# Patient Record
Sex: Male | Born: 1990 | Race: White | Hispanic: Yes | Marital: Single | Smoking: Never smoker
Health system: Southern US, Community
[De-identification: ages and names within clinical notes are randomized; demographics above are authoritative.]

## PROBLEM LIST (undated history)

## (undated) DIAGNOSIS — N319 Neuromuscular dysfunction of bladder, unspecified: Secondary | ICD-10-CM

## (undated) DIAGNOSIS — Q059 Spina bifida, unspecified: Secondary | ICD-10-CM

## (undated) HISTORY — PX: MENINGOCELE REPAIR: SHX712

## (undated) HISTORY — DX: Neuromuscular dysfunction of bladder, unspecified: N31.9

## (undated) HISTORY — PX: VENTRICULOPERITONEAL SHUNT: SHX204

## (undated) HISTORY — DX: Spina bifida, unspecified: Q05.9

---
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
## 2011-02-04 ENCOUNTER — Ambulatory Visit (INDEPENDENT_AMBULATORY_CARE_PROVIDER_SITE_OTHER): Payer: Medicaid Other | Admitting: Family Medicine

## 2011-02-04 ENCOUNTER — Other Ambulatory Visit: Payer: Self-pay | Admitting: Family Medicine

## 2011-02-04 VITALS — BP 143/92 | HR 166 | Temp 99.0°F

## 2011-02-04 DIAGNOSIS — A499 Bacterial infection, unspecified: Secondary | ICD-10-CM | POA: Insufficient documentation

## 2011-02-04 DIAGNOSIS — N3942 Incontinence without sensory awareness: Secondary | ICD-10-CM

## 2011-02-04 DIAGNOSIS — N390 Urinary tract infection, site not specified: Secondary | ICD-10-CM

## 2011-02-04 DIAGNOSIS — G839 Paralytic syndrome, unspecified: Secondary | ICD-10-CM

## 2011-02-04 LAB — POCT URINALYSIS DIPSTICK
Ketones, UA: NEGATIVE
Protein, UA: >300
Spec Grav, UA: 1.025
Urobilinogen, UA: 0.2

## 2011-02-04 LAB — POCT UA - MICROSCOPIC ONLY

## 2011-02-04 MED ORDER — CEPHALEXIN 500 MG PO CAPS: 500.0000 mg | ORAL_CAPSULE | Freq: Three times a day (TID) | ORAL | Status: DC

## 2011-02-04 MED ORDER — CEFTRIAXONE SODIUM 1 G IJ SOLR
1.0000 g | Freq: Once | INTRAMUSCULAR | Status: AC
  Administered 2011-02-04: 1 g via INTRAMUSCULAR

## 2011-02-04 NOTE — Assessment & Plan Note (Addendum)
Although he has been performing I&0 catheterizations QID he appears to have an upper tract infection. Will start with IM Ceftriaxone in case he develops vomiting before the oral medication is effective. Prescription written due to unreliability of [facility] e prescribing

## 2011-02-04 NOTE — Assessment & Plan Note (Signed)
unchanged

## 2011-02-04 NOTE — Assessment & Plan Note (Signed)
Unchanged

## 2011-02-04 NOTE — Patient Instructions (Signed)
Has recibido una inyeccion de antibiotico.  Tiene que continuar con Cephalexin para a lo menos 7 dias. Va a la sala de emergencia si empiece a vomitar y no puedo tomar el antibiotico.   Regrese en Hector Shea   [facility] in [facility]

## 2011-02-04 NOTE — Progress Notes (Signed)
  Subjective:    Patient ID: Hector Shea, male    DOB: 06/07/1990, 20 y.o.   MRN: [id number]  HPI one was last seen in [facility] at urology Department on October 31. He denies having infection symptoms but they did do a urine of which I did not get a result. November 2 he began having fever chills possibly looking urine with odor and slight blood. He's had decreased appetite, and mild nausea but no vomiting. He has been able to drink liquids well. He had loose bowel movement yesterday and today.  He continues to do school primarily learning English. Enjoys this but finds it difficult. He only had 6 years of schooling in [place]   Review of Systems     Objective:   Physical Exam He appears moderately ill sitting in a wheelchair. Back no CVA tenderness Chest clear Heart regular rhythm without murmur but significant tachycardia Abdomen exam is sitting shows old scars from prior surgery but no focal tenderness       Assessment & Plan:

## 2011-02-06 LAB — GRAM STAIN
Gram Stain: NONE SEEN
Gram Stain: NONE SEEN

## 2011-02-06 LAB — URINE CULTURE: Colony Count: >=100000

## 2011-02-11 ENCOUNTER — Ambulatory Visit (INDEPENDENT_AMBULATORY_CARE_PROVIDER_SITE_OTHER): Payer: Medicaid Other | Admitting: Family Medicine

## 2011-02-11 ENCOUNTER — Encounter: Payer: Self-pay | Admitting: Family Medicine

## 2011-02-11 VITALS — BP 117/70 | HR 80 | Temp 97.8°F

## 2011-02-11 DIAGNOSIS — A499 Bacterial infection, unspecified: Secondary | ICD-10-CM

## 2011-02-11 DIAGNOSIS — N390 Urinary tract infection, site not specified: Secondary | ICD-10-CM

## 2011-02-11 DIAGNOSIS — Z23 Encounter for immunization: Secondary | ICD-10-CM

## 2011-02-11 MED ORDER — CEPHALEXIN 500 MG PO CAPS: 500.0000 mg | ORAL_CAPSULE | Freq: Three times a day (TID) | ORAL | Status: AC

## 2011-02-11 MED ORDER — KETOCONAZOLE 2 % EX CREA: TOPICAL_CREAM | Freq: Two times a day (BID) | CUTANEOUS | Status: DC

## 2011-02-11 NOTE — Patient Instructions (Signed)
Fue un Research officer, trade union.  Me alegro que esta' mejor que la semana pasada, y que no ha vuelto a Warehouse manager mas fiebres ni escalofrios.   Estoy extendiendo su tratamiento con Keflex 500mg , una capsula tres veces por dia, por 3 dias mas (un total de 10 dias de tratamiento).  Por favor llame si comienza a tener fiebres, escalofrios, si la orina comienza a aparecer turbia o maloliente, o con otras preguntas o problemas.

## 2011-02-11 NOTE — Assessment & Plan Note (Signed)
Patient here for seven-day followup of what appears to have been an upper tract urinary tract infection. At the time of his prior presentation he had fevers chills and malodorous urine. It was thought that this is related to self-catheterization. He has had no prior UTIs according to parents. He has responded remarkably well to oral Keflex and has had no fevers or chills since beginning the Keflex and the one dose of ceftriaxone IM last week. His urine culture done last week had a large amount of bacteria without identification of an isolated organism. Given his clinical improvement, I do not believe it is useful to collect another urine today. I am extending his Keflex treatment by 3 more days for a total of 10 days of treatment. He is given strict instructions on reasons to call or return to clinic for evaluation, those being fevers chills or return of malodorous or turbid urine. He has an appointment with his urologist in [place] on December 31. He knows not to wait until then if he should have a recurrence of symptoms.

## 2011-02-11 NOTE — Progress Notes (Signed)
  Subjective:    Patient ID: Hector Shea, male    DOB: 08-11-1990, 20 y.o.   MRN: [id number]  HPI Visit conducted in Spanish. The patient is present with both of his parents. This visit is a followup from one week ago, when on presented with chills fever and foul-smelling urine. He was treated with IM ceftriaxone x1, then Keflex 500 mg capsules, one capsule 3 times daily for 7 days. He has one capsule remaining. He reports he has had no further episodes of fevers or chills, and his urine has returned to normal. He has been eating and drinking normally, and has had no diarrhea or changes in bowel habits despite the Keflex.  He reports that he has had some central facial redness which has been diagnosed as fungal in the past and has responded nicely to ketoconazole cream. He asks for refill of this   Review of Systems No fevers chills, no nausea vomiting, no malodorous urine.    Objective:   Physical Exam Well-appearing, in a wheelchair, pleasant and a good historian. No apparent distress  HEENT neck is supple no cervical adenopathy. Oropharynx is clear without exudates. Slight cobblestoning is noted. Moist mucous membranes are noted. Heart: Regular S1-S2 without extra Lungs: Clear to auscultation bilaterally. Abdomen: Soft nontender and nondistended  Surgical scars are noted and healed.       Assessment & Plan:

## 2011-02-11 NOTE — Progress Notes (Signed)
Addended byArlyss Repress on: 02/11/2011 03:12 PM   Modules accepted: Orders

## 2011-03-11 ENCOUNTER — Ambulatory Visit (INDEPENDENT_AMBULATORY_CARE_PROVIDER_SITE_OTHER): Payer: Medicaid Other | Admitting: Family Medicine

## 2011-03-11 VITALS — BP 140/82 | HR 85 | Temp 97.8°F

## 2011-03-11 DIAGNOSIS — J029 Acute pharyngitis, unspecified: Secondary | ICD-10-CM

## 2011-03-11 DIAGNOSIS — J020 Streptococcal pharyngitis: Secondary | ICD-10-CM

## 2011-03-11 MED ORDER — PENICILLIN G BENZATHINE & PROC 1200000 UNIT/2ML IM SUSP
1.2000 10*6.[IU] | Freq: Once | INTRAMUSCULAR | Status: AC
  Administered 2011-03-11: 1.2 10*6.[IU] via INTRAMUSCULAR

## 2011-03-11 NOTE — Assessment & Plan Note (Signed)
Has [facility] follow up on 12/31

## 2011-03-11 NOTE — Assessment & Plan Note (Signed)
Is self cathing on a daily basis per pt. Encouraged compliance with this.

## 2011-03-11 NOTE — Patient Instructions (Signed)
Angina por estreptococos (Strep Throat)  La angina estreptocccica es una infeccin en la garganta causada por una bacteria llamada Streptococcus pyogenes. El mdico la llamar "amigdalitis" o "faringitis" estreptocccica, segn haya signos de inflamacin en las amgdalas o en la zona posterior de la garganta. Es ms frecuente en nios entre los 5 y los 15 aos durante los meses de fro, Biomedical engineer puede ocurrir a Dealer de Actuary. La infeccin se transmite de persona a persona (contagio) a travs de la tos, el estornudo u otro contacto cercano.  SNTOMAS  Fiebre o escalofros.   La garganta o las [employer] duelen y estn inflamadas.   Dolor o dificultad para tragar.   Puntos blancos o amarillos en las amgdalas o la garganta.   Ganglios linfticos hinchados a los lados del cuello o debajo de la [place].   Erupcin rojiza en todo el cuerpo (poco comn).  DIAGNSTICO Diferentes infecciones pueden causar los mismos sntomas. Para confirmar el diagnstico deber [employer], y as podrn prescribirle el tratamiento adecuado. La "prueba rpida de estreptococo" ayudar al mdico a hacer el diagnstico en algunos minutos. Si no se dispone de la prueba, se har un rpido hisopado de la zona afectada para hacer un cultivo de las secreciones de la garganta. Si se hace un cultivo, los resultados estarn disponibles en [employer].  TRATAMIENTO El estreptococo de garganta se trata con antibiticos. INSTRUCCIONES PARA EL CUIDADO DOMICILIARIO  Haga grgaras con 1 cucharadita de sal en 1 taza de agua tibia, 3  4 veces por da o cuando lo crea necesario para sentirse mejor.   Los miembros de la familia que tambin tengan dolor en la garganta deben ser evaluados y tratados con antibiticos si tienen la infeccin.   Asegrese de que todas las personas de su casa se laven [employer].   No comparta alimentos, tazas ni utensilios personales que puedan contagiar la infeccin.   Coma  alimentos blandos hasta que el dolor de garganta mejore.   Beba gran cantidad de lquido para mantener la orina de tono claro o color amarillo plido. Esto ayudar a Agricultural engineer.   Debe hacer reposo.   No concurra a la escuela o la trabajo hasta que haya tomado los antibiticos durante 24 horas.   Utilice los medicamentos de venta libre o de prescripcin para Chief Technology Officer, Environmental health practitioner o la [place], segn se lo indique el profesional que lo asiste.   Si le han recetado medicamentos, tmelos segn las indicaciones. Finalice la prescripcin completa, aunque se sienta mejor.  SOLICITE ATENCIN MDICA SI:  Los ganglios del cuello siguen agrandados.   Aparece una erupcin cutnea, tos o dolor de odos.   Tiene un catarro verde, amarillo amarronado o esputo sanguinolento.   Siente dolor o Dentist que no puede controlar con los medicamentos.   Los sntomas parecen empeorar en vez de mejorar.  SOLICITE ATENCIN MDICA DE INMEDIATO SI:  Presenta algn nuevo sntoma, como vmitos, dolor de cabeza intenso, rigidez o dolor en el cuello, dolor en el pecho o problemas respiratorios o dificultad para tragar.   Presenta dolor de garganta intenso, babeo o cambios en la voz.   Siente que el cuello se hincha o la piel de esa zona se vuelve roja y sensible.   Tiene fiebre.   Tiene signos de deshidratacin, como fatiga, boca seca y disminucin de la [place].   Comienza a sentir mucho sueo, o no puede despertarse bien.  Document Released: 12/26/2004 Document Revised: 11/28/2010 ExitCare  Patient Information 2012 [employer]. 

## 2011-03-11 NOTE — Progress Notes (Signed)
  Subjective:    Patient ID: Hector Shea, male    DOB: Aug 10, 1990, 20 y.o.   MRN: [id number]  HPI URI sxs x 5 days. Sxs include nasal congestion, generalized malaise. HA. Predominant sx is sore throat. + subjective fevers and chills. No sick contacts. No rash. Has been tolerating pt intake.  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 Pt and mom states that pt has been self cathing on a daily basis. No abd pain, nausea, vomiting. Pt has had flu shot.  Pt and family states that pt has had strep throat in [place] in the past.  Pt has an appt with [facility]omy[facility] on 12/31. Review of Systems See HPI, otherwise 12 point ROS negative.   He has no current UTI symptoms. His is self-cathing 4 times daily. Has a urology visit at [facility] Dec. 30th.     Objective:   Physical Exam Gen: up in chair, NAD, wheelchair bound  HEENT: NCAT, EOMI, TMs clear bilaterally, + mild post-oropharyngeal erythema  CV: RRR, no murmurs auscultated PULM: CTAB, no wheezes, rales, rhoncii ABD: S/NT/+ bowel sounds     Assessment & Plan:

## 2011-03-11 NOTE — Progress Notes (Signed)
Due to language barrier, an interpreter was present during the history-taking and subsequent discussion (and for part of the physical exam) with this patient. Interpreter Wyvonnia Dusky [facility]  03/11/2011

## 2011-03-11 NOTE — Assessment & Plan Note (Signed)
Strep swab positive. Will treat with bicillin 1.2 million units. Discussed red flags for reevaluation. Handout given.

## 2011-04-10 ENCOUNTER — Ambulatory Visit (INDEPENDENT_AMBULATORY_CARE_PROVIDER_SITE_OTHER): Payer: Medicaid Other | Admitting: Family Medicine

## 2011-04-10 DIAGNOSIS — L219 Seborrheic dermatitis, unspecified: Secondary | ICD-10-CM

## 2011-04-10 MED ORDER — KETOCONAZOLE 2 % EX SHAM: MEDICATED_SHAMPOO | CUTANEOUS | Status: DC

## 2011-04-10 MED ORDER — DIPHENHYDRAMINE HCL 2 % EX GEL: 1.0000 "application " | Freq: Two times a day (BID) | CUTANEOUS | Status: DC

## 2011-04-10 NOTE — Patient Instructions (Signed)
Fue un Research officer, trade union.  Para la comezon en la cara, lo mas importante es no usar la crema que le provoco' la Geneticist, molecular.  Si por acaso tiene [employer] casa, guardela y traigamela para saber el nombre de la [place].  Para la caspa le estoy recetando un champu con medicamento, que se llama de ketoconazole 2%.  Usela 2 veces por semana; tambien puede usarla como enjuage en la cara.   Siga con el jabon de bano [employer] sin perfumes.    Le recete' un gel de benadryl para la comezon; uselo nada mas que por el tiempo que lo necesite y suspendalo cuando este' mejor.

## 2011-04-11 ENCOUNTER — Encounter: Payer: Self-pay | Admitting: Family Medicine

## 2011-04-11 NOTE — Progress Notes (Signed)
  Subjective:    Patient ID: Hector Shea, male    DOB: December 30, 1990, 21 y.o.   MRN: [id number]  HPI Visit conducted in Spanish.   Lum comes in with mother today; complaint of acute-onset facial rash at site where he applied a new facial cream 2 days ago.  When seen by me last, was given a Rx for topical ketoconazole, which was never dispensed by the pharmacist.  He is unsure what the cream was that he applied 2 days ago that precipitated the breakout.  Threw the packet away, was in a small envelope (like a sample).  He had used it because of dryness on the face.  No new soaps; uses bar soap Dove with scent.  Had been using Head and Shoulder Shampoo for dandruff control, but this is not better.    Review of Systems See above     Objective:   Physical Exam Alert, no apparent distress HEENT: Central facial redness with exfoliation.  Clear mucus membranes. Scalp with profuse seborrhea, no alopecia or exclamation point hairs.  No cervical adenopathy       Assessment & Plan:

## 2011-04-11 NOTE — Assessment & Plan Note (Signed)
Seborrheic dermatitis, along with topical irritant dermatitis of face.  Unclear what offending agent is; stopped it yesterday.  Avoidance of offending agent.  Unscented Dove or [employer]illard's.  Change to ketoconazole shampoo for seborrhea.  If he finds the container for the offending cream, to write down/bring in to record on Allergy list in EMR.

## 2011-07-26 ENCOUNTER — Telehealth: Payer: Self-pay | Admitting: Family Medicine

## 2011-07-26 MED ORDER — OXYBUTYNIN CHLORIDE 5 MG PO TABS: 5.0000 mg | ORAL_TABLET | Freq: Three times a day (TID) | ORAL | Status: DC

## 2011-07-26 NOTE — Telephone Encounter (Signed)
Meds updated based on urology report from [facility]

## 2012-07-15 ENCOUNTER — Telehealth: Payer: Self-pay | Admitting: Family Medicine

## 2012-07-15 NOTE — Telephone Encounter (Signed)
Forward to [employer] to call spanish speaking patient. Please let him know Dr Sheffield Slider is out until next week, I will have to check with him when he returns about the paperwork.[name], [name]

## 2012-07-15 NOTE — Telephone Encounter (Signed)
Patient hasn't been seen since January of 2013.  Call is about the status of the paperwork for wheelchair repairs that was faxed over on 4/8.

## 2012-07-16 NOTE — Telephone Encounter (Signed)
Robert both phone number that we have in our system are wrong. I am unable to communicate with PT.    [employer]

## 2012-07-27 ENCOUNTER — Ambulatory Visit (INDEPENDENT_AMBULATORY_CARE_PROVIDER_SITE_OTHER): Payer: Medicaid Other | Admitting: Family Medicine

## 2012-07-27 ENCOUNTER — Encounter: Payer: Self-pay | Admitting: Family Medicine

## 2012-07-27 VITALS — BP 139/84 | HR 71 | Temp 98.3°F

## 2012-07-27 DIAGNOSIS — Z982 Presence of cerebrospinal fluid drainage device: Secondary | ICD-10-CM

## 2012-07-27 DIAGNOSIS — B372 Candidiasis of skin and nail: Secondary | ICD-10-CM | POA: Insufficient documentation

## 2012-07-27 DIAGNOSIS — G839 Paralytic syndrome, unspecified: Secondary | ICD-10-CM

## 2012-07-27 DIAGNOSIS — N3942 Incontinence without sensory awareness: Secondary | ICD-10-CM

## 2012-07-27 DIAGNOSIS — B353 Tinea pedis: Secondary | ICD-10-CM

## 2012-07-27 MED ORDER — NYSTATIN 100000 UNIT/GM EX POWD: Freq: Three times a day (TID) | CUTANEOUS | Status: DC

## 2012-07-27 NOTE — Progress Notes (Signed)
  Subjective:    Patient ID: Hector Shea, male    DOB: [birth year], 22 y.o.   MRN: [id number]  HPI Paraparesis - He's had an electric wheel chair from [employer] x 3 years and the battery isn't recharging sufficiently. The company told them that he needs a prescription from [place] to get a new one. He comes in in a manual wheelchair that he has borrowed.   Skin - he reports a rash on his buttocks and genitalia. He self-caths four times daily for his neurogenic urinary retention. He takes Oxybutynin prescribed by [facility] for his bladder. Still wears a diaper and is incontinent of urine at times. He has fungus on his toenails, but not between his toes. Took pills in [place] for 2 fingernails when he had it there.   Headaches - No longer having these since he stopped taking high school classes. He wore glasses then, but not recently. Doesn't read or watch TV much. Not doing much. No fever or chills   Review of Systems     Objective:   Physical Exam  Constitutional: He is oriented to person, place, and time. He appears well-nourished.  HENT:  Large head,  Subcutaneous shunt reservoir palpable, surgical scars  Cardiovascular: Regular rhythm.   Murmur heard. Pulmonary/Chest: Effort normal and breath sounds normal. He has no wheezes. He has no rales.  Abdominal: Soft. Bowel sounds are normal. He exhibits no distension and no mass. There is no tenderness. There is no guarding.  Well healed RUQ scar  Genitourinary:  Large scrotum  Musculoskeletal: He exhibits no edema.  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  Skin:  Wearing a damp diaper.  Diffuse superficially desquamating erythema of perineum, scrotum, and central buttocks without ulcerations  Feet bilateral tinea unguinum, but not pedis  Lumbar surgical scars.   Psychiatric: He has a normal mood and affect. His behavior is normal. Judgment and thought content  normal.          Assessment & Plan:

## 2012-07-27 NOTE — Patient Instructions (Addendum)
Apique el polvo medicamento para hongo a la piel rosado 3 veces al dia hasta regresar al normal. Exponer la piel al aire para secar la piel va a ayudarla a sanarse.   Voy a mandar una [employer] para una nueva pila para la silla de Acupuncturist.

## 2012-07-27 NOTE — Assessment & Plan Note (Signed)
I spoke with the secretary at [employer] who said they would send someone to service the wheelchair after I fax a prescription, which I did.

## 2012-07-27 NOTE — Assessment & Plan Note (Signed)
Continued incontinence despite self-catheterization and oxybutynin

## 2012-07-27 NOTE — Assessment & Plan Note (Signed)
Severe, but without ulceration. Mycostatin powder sent to pharmacy

## 2012-07-27 NOTE — Assessment & Plan Note (Signed)
No indications of shunt obstruction or infection

## 2012-07-27 NOTE — Assessment & Plan Note (Signed)
Nails cut too short, but not ingrown. No current reason to treat with oral medication.

## 2012-09-02 ENCOUNTER — Telehealth: Payer: Self-pay | Admitting: Family Medicine

## 2012-09-02 NOTE — Telephone Encounter (Signed)
[place] DMA form signed for advanced joystick and labor for his [employer]

## 2013-02-08 ENCOUNTER — Ambulatory Visit (INDEPENDENT_AMBULATORY_CARE_PROVIDER_SITE_OTHER): Payer: Medicare Other | Admitting: Family Medicine

## 2013-02-08 ENCOUNTER — Encounter: Payer: Self-pay | Admitting: Family Medicine

## 2013-02-08 VITALS — BP 126/81 | HR 76 | Temp 98.4°F

## 2013-02-08 DIAGNOSIS — L219 Seborrheic dermatitis, unspecified: Secondary | ICD-10-CM

## 2013-02-08 MED ORDER — KETOCONAZOLE 2 % EX SHAM: MEDICATED_SHAMPOO | CUTANEOUS | Status: DC

## 2013-02-08 MED ORDER — BETAMETHASONE DIPROPIONATE AUG 0.05 % EX OINT
TOPICAL_OINTMENT | Freq: Two times a day (BID) | CUTANEOUS | Status: DC
Start: 2013-02-08 — End: 2013-09-21

## 2013-02-08 NOTE — Patient Instructions (Signed)
Dermatitis seborreica  (Seborrheic Dermatitis) La dermatitis seborreica se observa como una zona de color rojo o rosado en la piel con escamas grasas. Aparece generalmente en el cuero cabelludo, las cejas, la nariz, la zona de la barba y detrs de las [place]. Tambin puede aparecer en la parte central del pecho. Generalmente ocurre en las zonas donde hay ms glndulas de secrecin grasa (sebceas). Este problema tambin se conoce con el nombre de caspa. Cuando afecta el cuero cabelludo de un beb, se denomina costra lctea. Puede aparecer y desaparecer sin motivo conocido. Puede ocurrir en cualquier momento de la vida, desde la infancia hasta la vejez.  CAUSAS  La causa es desconocida. No es el resultado de muy poca humedad o [place]. En [employer], los brotes de dermatitis seborreica parecen estar causados por el estrs. Tambin es frecuente que aparezca en personas con ciertas enfermedades como la enfermedad de Parkinson o el VIH / SIDA.  SNTOMAS   Escamas gruesas en el cuero cabelludo.  Enrojecimiento en la cara o en las axilas.  La piel puede parecer grasa o seca, pero las cremas hidratantes no la mejoran.  En los lactantes, la dermatitis seborreica aparece como una lesin roja y escamosa que no parece molestar al beb. En algunos bebs slo afecta al cuero cabelludo. En otros casos, tambin afecta a los pliegues del cuello, las [place], la [place], o detrs de las [place].  En los adultos y [place], la dermatitis seborreica puede afectar el cuero cabelludo. Puede parecer en reas o extenderse, con reas de enrojecimiento y descamacin. Otras reas comnmente afectadas son:  Las cejas.  Los prpados.  La frente.  La piel detrs de las [place].  Los odos externos.  El pecho.  Las [place].  Los pliegues de la Clinical cytogeneticist.  Los pliegues debajo de las [place].  La piel [employer].  La ingle.  Algunos adultos y adolescentes siente picazn o ardor en las reas  afectadas. DIAGNSTICO  El mdico puede diagnosticar el problema haciendo un examen fsico.  TRATAMIENTO   Ungentos, cremas y lociones con cortisona (corticoides), ayudan a disminuir la inflamacin.  Los bebs pueden ser tratados con aceite para bebs, para [employer], y luego se deben lavar con champ para bebs. Si esto no ayuda, un corticoide tpico recetado puede ser de [place].  Los adultos tambin pueden usar [employer].  El mdico prescribir una crema con corticoides y un champ que contenga un medicamento contra los hongos (ketoconazole). La crema con hidrocortisona o la antihongos puede frotarse directamente en las zonas de la dermatitis seborreica. Los hongos no son la causa del trastorno Engineer, maintenance. En los bebs, la dermatitis seborreica generalmente es Archivist ao de vida. Tiende a desaparecer sin tratamiento cuando el nio crece. Sin embargo, [employer] aos de Psychologist, educational. En los adultos y [place], la dermatitis seborreica tiende a ser una enfermedad crnica que aparece y desaparece durante muchos aos.  INSTRUCCIONES PARA EL CUIDADO EN EL HOGAR   Use los medicamentos recetados segn las indicaciones.  En los bebs, no elimine de [employer] o polvillos del cuero cabelludo con un peine o por otros medios. Esto puede causarle la prdida del cabello. SOLICITE ATENCIN MDICA SI:   El problema no mejora con los champs medicinales, las lociones u otros medicamentos que le prescriba el mdico.  Tiene otras preguntas o preocupaciones. Document Released: 03/04/2012 [facility] Patient Information 2014 [place], [place].  Regresar al officina en 2-4  semanas.

## 2013-02-08 NOTE — Assessment & Plan Note (Signed)
Patient presents with flare of seborrheic dermatitis. -Patient was given a prescription of Diprolene ointment to be used twice daily for one week -Patient to use ketoconazole shampoo on the face and scalp for the next 2 weeks -Return to office in 2-4 weeks to recheck

## 2013-02-08 NOTE — Progress Notes (Signed)
  Subjective:    Patient ID: Hector Shea, male    DOB: [birth year], 22 y.o.   MRN: [id number]  HPI 22 year old male with past medical history of seborrheic dermatitis presents for acute flare up, his symptoms been worsening for the past 2 months, he has been applying over-the-counter Benadryl cream, this has provided minimal relief of the symptoms, she describes redness of the face as well as puritis, he has not attempted any over-the-counter steroid or antifungal medications   Review of Systems  Constitutional: Negative for fever, chills and fatigue.  HENT: Negative for congestion.   Gastrointestinal: Negative for nausea, vomiting, diarrhea and constipation.  Skin: Positive for rash.       Objective:   Physical Exam Vitals: Reviewed General: Pleasant Hispanic male, no acute distress HEENT: Pupils are equal round and reactive to light, extraocular movements are intact, no scleral icterus, moist mucous members, uvula midline, neck was supple, no anterior posterior cervical lymphadenopathy Cardiac: Regular rate and rhythm, S1 and S2 present, no murmurs Respiratory: Clear to auscultation bilaterally, normal effort Skin: Rectal papular rash present over the face and ear lobes, overlying scaling assistant was seborrheic dermatitis       Assessment & Plan:  Please see problem specific assessment and plan.

## 2013-09-08 ENCOUNTER — Telehealth: Payer: Self-pay | Admitting: Family Medicine

## 2013-09-08 NOTE — Telephone Encounter (Signed)
Note to nursing staff - please schedule patient for office visit, [employer] requires an office visit within the past 6 months documenting the patients condition in order to complete repairs on wheelchair, thanks

## 2013-09-09 NOTE — Telephone Encounter (Signed)
Appointment scheduled for Monday June 15 at 8:30 am.Hector Shea, Hector Shea

## 2013-09-13 ENCOUNTER — Ambulatory Visit: Payer: Medicare Other | Admitting: Family Medicine

## 2013-09-21 ENCOUNTER — Encounter: Payer: Self-pay | Admitting: Family Medicine

## 2013-09-21 ENCOUNTER — Ambulatory Visit (INDEPENDENT_AMBULATORY_CARE_PROVIDER_SITE_OTHER): Payer: Medicare Other | Admitting: Family Medicine

## 2013-09-21 VITALS — BP 129/76 | HR 82 | Temp 97.9°F

## 2013-09-21 DIAGNOSIS — N3942 Incontinence without sensory awareness: Secondary | ICD-10-CM

## 2013-09-21 DIAGNOSIS — G839 Paralytic syndrome, unspecified: Secondary | ICD-10-CM

## 2013-09-21 MED ORDER — OXYBUTYNIN CHLORIDE 5 MG PO TABS: 5.0000 mg | ORAL_TABLET | Freq: Three times a day (TID) | ORAL | Status: DC

## 2013-09-21 NOTE — Patient Instructions (Signed)
Dr. Randolm IdolFletke will complete the paperwork for [employer] to have the wheelchair repairs completed.   A prescription for catheters has been given, please go to [facility] to pick these up.

## 2013-09-22 NOTE — Assessment & Plan Note (Signed)
Patient is wheelchair-bound secondary to paraparesis from spina bifida. Face-to-face visit conducted so that patient can get wheelchair repairs.

## 2013-09-22 NOTE — Assessment & Plan Note (Signed)
Incontinence improved with self-catheterization and oxybutynin -Refills given for catheters and oxybutynin

## 2013-09-22 NOTE — Progress Notes (Signed)
   Subjective:    Patient ID: Hector CosJuan Shea, male    DOB: June 25, 1990, 23 y.o.   MRN: [id number]  HPI 23 year old Hispanic male with past medical history of spina bifida with hydrocephalus of the lumbar region as well as paraparesis of bilateral lower extremities presents for face-to-face visit in order to get wheelchair repair.  Patient states that his wheelchair was recently evaluated by a technician from [employer] who found that his battery and/or charger has been malfunctioning. In order to have this repaired the patient required a face-to-face visit.  The patient states that he is doing well with his current wheelchair other than the battery issues. The patient is nonambulatory due to his spina bifida and paraparesis. He reports no skin breakdowns.  Patient requires intermittent catheterization 4 times daily, he needs a refill of 10-gauge urinary catheters, he also needs a refill of oxybutynin as he has bladder spasms which have been well controlled on this medication   Review of Systems  Constitutional: Negative for fever, chills and fatigue.  Gastrointestinal: Positive for vomiting. Negative for nausea and diarrhea.  Genitourinary: Negative for urgency and decreased urine volume.       Objective:   Physical Exam Vitals: Reviewed General: Pleasant male, no acute distress Cardiac: Regular rate and rhythm, S1 and S2 present, no murmurs, no heaves or thrills Respiratory: Clear to auscultation bilaterally, normal effort Abdomen: Soft, nontender, bowel sounds present MSK: Patient sitting in a motorized wheelchair, right lower extremity and soft brace  Neuro: Patient is unable to move bilateral lower extremities, has minimal sensation to light touch in bilateral lower extremities     Assessment & Plan:  Please see problem specific assessment and plan.

## 2013-09-29 ENCOUNTER — Telehealth: Payer: Self-pay | Admitting: Family Medicine

## 2013-09-29 NOTE — Telephone Encounter (Signed)
RX for oxy-butin is wrong. He takes 2 in the morning, 2 at night and one at lunch. Please advise

## 2013-10-04 MED ORDER — OXYBUTYNIN CHLORIDE 5 MG PO TABS: ORAL_TABLET | ORAL | Status: AC

## 2013-10-04 NOTE — Telephone Encounter (Signed)
Confirmed with patient that he takes the oxybutyin as outlined below. New prescription sen to pharmacy.

## 2014-10-10 ENCOUNTER — Ambulatory Visit: Payer: Medicare Other | Admitting: Family Medicine

## 2014-10-18 ENCOUNTER — Encounter: Payer: Self-pay | Admitting: Family Medicine

## 2014-10-18 ENCOUNTER — Ambulatory Visit (INDEPENDENT_AMBULATORY_CARE_PROVIDER_SITE_OTHER): Payer: Medicare Other | Admitting: Family Medicine

## 2014-10-18 VITALS — BP 118/80 | HR 85 | Temp 97.8°F

## 2014-10-18 DIAGNOSIS — N3942 Incontinence without sensory awareness: Secondary | ICD-10-CM | POA: Diagnosis not present

## 2014-10-18 DIAGNOSIS — Q052 Lumbar spina bifida with hydrocephalus: Secondary | ICD-10-CM

## 2014-10-18 DIAGNOSIS — Z0000 Encounter for general adult medical examination without abnormal findings: Secondary | ICD-10-CM | POA: Diagnosis not present

## 2014-10-18 NOTE — Patient Instructions (Signed)
It was nice to see you today.  Please return in one year for an annual physical.

## 2014-10-18 NOTE — Progress Notes (Signed)
   Subjective:    Patient ID: Hector Shea, male    DOB: 1990/04/18, 24 y.o.   MRN: [id number]  HPI 24 y/o male with past medical history of spina bifida, paraparesis, and urinary incontinence presents for annual preventative visit and physical.  Urinary incontinence - controlled with self catheterization QID, taking Ditropan daily with no reported bladder spasm.  Spina Bifida/Paraparesis - no skin breakdown on extremities, no back pain (s/p surgery as an infant)  Patient reports no acute issues.  Social - non smoker, no alcohol use, no illicit drug use, no sexual activity (has never had sex due to above), lives with parents, does not work  Immunizations - up to date  Not a candidate for cancer screening at this time.    Review of Systems  Constitutional: Negative for fever, chills and fatigue.  Respiratory: Negative for cough and shortness of breath.   Cardiovascular: Negative for chest pain and leg swelling.  Gastrointestinal: Negative for nausea, vomiting, diarrhea and constipation.       Objective:   Physical Exam Vitals: reviewed, BP mildly elevated on first check however improved on recheck HEENT: normocephalic, PERRL, EOMI, no scleral icterus, nasal septum midline, no rhinorrhea, MMM, uvula midline, neck supple, no thyromegaly Cardiac: RRR, S1 and S2 present, no murmur, no heaves/thrills Resp: CTAB, normal effort Abd: soft, no tenderness, normal bowel sounds Ext: contracture of bilateral legs, right leg in straight leg brace Skin: midline scar over lumbar spine well healed, no skin breakdown on extremities MSK: in motorized wheelchair       Assessment & Plan:  Please see problem specific assessment and plan.

## 2014-10-18 NOTE — Assessment & Plan Note (Signed)
No current symptoms

## 2014-10-18 NOTE — Assessment & Plan Note (Addendum)
Patient presents for annual visit. -up to date on immunizations -not a candidate for cancer screening at this time -not a candidate for STD testing as no previous/current sexual activitiy

## 2014-10-18 NOTE — Assessment & Plan Note (Signed)
Stable with Ditropan and QID catheterization.

## 2016-04-25 NOTE — Progress Notes (Signed)
Subjective:    Patient ID: Hector Shea , male   DOB: 12-21-1990 , 26 y.o..   MRN: [id number]  Spanish interpreter used for this visit  HPI  Hector Shea is a 26 yo male with PMH of spina bifida, paraparesis, and urinary incontinence here for a same day visit for   Abnormal urine: Patient is here today because he notes that his stomach feels "swollen" and he has had dark yellow urine for the last 2 days. He notes that his stomach feels very full. He doesn't have a lot of pain. He denies any nausea, vomiting, constipation. He notes that he had 1 loose nonbloody bowel movement this morning and 1 loose bowel movement yesterday. He admits to a subjective fever and some shortness of breath yesterday. He notes that when he urinates it doesn't hurt and there is no odor but there is a strong yellow color. Patient is wheelchair bound as he has paraparesis neck and area to spina bifida. He also notes that he is incontinent of urine but there is no been much change to the amount of times he urinates.  Review of Systems: Per HPI. All other systems reviewed and are negative.  Past Medical History: Patient Active Problem List   Diagnosis Date Noted  . Abnormal urine 04/26/2016  . Preventative health care 10/18/2014  . Presence of ventricular shunt 07/27/2012  . NYSTAGMUS 11/13/2009  . PARAPARESIS 09/11/2009  . 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 Urinary incontinence without sensory awareness 09/11/2009    Medications: reviewed Current Outpatient Prescriptions  Medication Sig Dispense Refill  . cephALEXin (KEFLEX) 500 MG capsule Take 1 capsule (500 mg total) by mouth 2 (two) times daily. 14 capsule 0  . oxybutynin (DITROPAN) 5 MG tablet Take 2 tablets in the morning, 1 tablet at lunch, and 2 tablets at night for bladder spasm. 270 tablet 1   No current facility-administered medications for this visit.     Social Hx:  reports that he has never  smoked. He does not have any smokeless tobacco history on file.   Objective:   BP 110/70   Pulse 99   Temp 98.1 F (36.7 C) (Oral)   SpO2 99%  Physical Exam  Gen: NAD, alert, cooperative with exam, well-appearing, sitting upright in wheelchair HEENT: NCAT, PERRL, scleral icterus bilaterally, nystagmus, moist mucous membranes Cardiac: Regular rate and rhythm, normal S1/S2, no edema, capillary refill brisk  Respiratory: Clear to auscultation bilaterally, no wheezes, non-labored breathing Gastrointestinal: Limited as patient was sitting up in a wheelchair: soft, non tender, non distended, bowel sounds hypoactive, well-healed incisional scar on the right lower quadrant. Psych: good insight, normal mood and affect  Results for orders placed or performed in visit on 04/26/16  Urinalysis Dipstick  Result Value Ref Range   Color, UA AMBER    Clarity, UA CLOUDY    Glucose, UA 100    Bilirubin, UA LARGE    Ketones, UA >=160    Spec Grav, UA 1.025    Blood, UA SMALL    pH, UA 6.0    Protein, UA 100    Urobilinogen, UA 1.0    Nitrite, UA POSITIVE    Leukocytes, UA large (3+) (A) Negative  POCT UA - Microscopic Only  Result Value Ref Range   WBC, Ur, HPF, POC TOO MANY TO COUNT    RBC, urine, microscopic RARE    Bacteria, U Microscopic 3+ RODS    Epithelial cells, urine  per micros NONE     Assessment & Plan:  Abnormal urine Unclear etiology of patient's symptoms of dark color urine. Exam findings concerning for scleral icterus however abdominal exam benign.  Urinalysis obtained via straight cath in the clinic, results showing evidence of UTI. Will need to rule out any biliary pathology given patient's dark urine color, large early Ruben and scleral icterus. Precepted visit with Dr. Leveda AnnaHensel  -Called patient with spanish interpreter after he left clinic and discussed urinalysis results, will treat for infection at this time. Prescription for Keflex into patient's pharmacy: 500 mg twice a  day 7 day course -Hepatitis panel, CMP, CBC ordered -Right upper quadrant ultrasound ordered -We'll follow up with results, patient advised to follow-up with PCP -Discussed reasons to go to the hospital   Anders Simmondshristina Gambino, MD [facility], PGY-2

## 2016-04-26 ENCOUNTER — Ambulatory Visit (INDEPENDENT_AMBULATORY_CARE_PROVIDER_SITE_OTHER): Payer: Medicare Other | Admitting: Family Medicine

## 2016-04-26 VITALS — BP 110/70 | HR 99 | Temp 98.1°F

## 2016-04-26 DIAGNOSIS — R3989 Other symptoms and signs involving the genitourinary system: Secondary | ICD-10-CM | POA: Diagnosis not present

## 2016-04-26 DIAGNOSIS — R17 Unspecified jaundice: Secondary | ICD-10-CM | POA: Diagnosis not present

## 2016-04-26 DIAGNOSIS — R8290 Unspecified abnormal findings in urine: Secondary | ICD-10-CM | POA: Diagnosis present

## 2016-04-26 LAB — CBC WITH DIFFERENTIAL/PLATELET
BASOS PCT: 0 %
Basophils Absolute: 0 cells/uL (ref 0–200)
EOS PCT: 1 %
Eosinophils Absolute: 65 cells/uL (ref 15–500)
HEMATOCRIT: 47.4 % (ref 38.5–50.0)
Hemoglobin: 15.7 g/dL (ref 13.2–17.1)
LYMPHS ABS: 1495 {cells}/uL (ref 850–3900)
LYMPHS PCT: 23 %
MCH: 29.6 pg (ref 27.0–33.0)
MCHC: 33.1 g/dL (ref 32.0–36.0)
MCV: 89.4 fL (ref 80.0–100.0)
MPV: 10.9 fL (ref 7.5–12.5)
Monocytes Absolute: 455 cells/uL (ref 200–950)
Monocytes Relative: 7 %
Neutro Abs: 4485 cells/uL (ref 1500–7800)
Neutrophils Relative %: 69 %
Platelets: 181 10*3/uL (ref 140–400)
RBC: 5.3 MIL/uL (ref 4.20–5.80)
RDW: 14.9 % (ref 11.0–15.0)
WBC: 6.5 10*3/uL (ref 3.8–10.8)

## 2016-04-26 LAB — POCT UA - MICROSCOPIC ONLY

## 2016-04-26 LAB — POCT URINALYSIS DIPSTICK
GLUCOSE UA: 100
Ketones, UA: >=160
NITRITE UA: POSITIVE
Protein, UA: 100
Spec Grav, UA: 1.025
UROBILINOGEN UA: 1
pH, UA: 6

## 2016-04-26 MED ORDER — CEPHALEXIN 500 MG PO CAPS: 500.0000 mg | ORAL_CAPSULE | Freq: Two times a day (BID) | ORAL | 0 refills | Status: DC

## 2016-04-26 NOTE — Assessment & Plan Note (Addendum)
Unclear etiology of patient's symptoms of dark color urine. Exam findings concerning for scleral icterus however abdominal exam benign.  Urinalysis obtained via straight cath in the clinic, results showing evidence of UTI. Will need to rule out any biliary pathology given patient's dark urine color, large early Ruben and scleral icterus. Precepted visit with Dr. Leveda AnnaHensel  -Called patient with spanish interpreter after he left clinic and discussed urinalysis results, will treat for infection at this time. Prescription for Keflex into patient's pharmacy: 500 mg twice a day 7 day course -Hepatitis panel, CMP, CBC ordered -Right upper quadrant ultrasound ordered -We'll follow up with results, patient advised to follow-up with PCP -Discussed reasons to go to the hospital

## 2016-04-26 NOTE — Patient Instructions (Signed)
Thank you for coming in today, it was so nice to see you! Today we talked about:    Dark urine and abdominal fullness: We will check your blood today for any abnormalities. We will also check her urine. We will need to get an ultrasound of her abdomen.  Please go to the hospital if you're not acting herself, you have nonstop diarrhea, or you are unable to urinate all day  Please follow up in 1 week with Dr. Randolm IdolFletke. You can schedule this appointment at the front desk before you leave or call the clinic.  If we ordered any tests today, you will be notified via telephone of any abnormalities. If everything is normal you will get a letter in the mail.   If you have any questions or concerns, please do not hesitate to call the office at [phone]. You can also message me directly via MyChart.   Sincerely,  Anders Simmondshristina Seila Liston, MD

## 2016-04-27 ENCOUNTER — Emergency Department (HOSPITAL_COMMUNITY): Payer: Medicare Other

## 2016-04-27 ENCOUNTER — Other Ambulatory Visit (HOSPITAL_COMMUNITY): Payer: Self-pay

## 2016-04-27 ENCOUNTER — Telehealth: Payer: Self-pay | Admitting: Family Medicine

## 2016-04-27 ENCOUNTER — Other Ambulatory Visit: Payer: Self-pay

## 2016-04-27 ENCOUNTER — Encounter (HOSPITAL_COMMUNITY): Payer: Self-pay | Admitting: Emergency Medicine

## 2016-04-27 DIAGNOSIS — N39498 Other specified urinary incontinence: Secondary | ICD-10-CM | POA: Diagnosis present

## 2016-04-27 DIAGNOSIS — K8063 Calculus of gallbladder and bile duct with acute cholecystitis with obstruction: Principal | ICD-10-CM | POA: Diagnosis present

## 2016-04-27 DIAGNOSIS — B3749 Other urogenital candidiasis: Secondary | ICD-10-CM | POA: Diagnosis present

## 2016-04-27 DIAGNOSIS — E860 Dehydration: Secondary | ICD-10-CM | POA: Diagnosis present

## 2016-04-27 DIAGNOSIS — L219 Seborrheic dermatitis, unspecified: Secondary | ICD-10-CM | POA: Diagnosis present

## 2016-04-27 DIAGNOSIS — R109 Unspecified abdominal pain: Secondary | ICD-10-CM | POA: Diagnosis not present

## 2016-04-27 DIAGNOSIS — N1330 Unspecified hydronephrosis: Secondary | ICD-10-CM | POA: Diagnosis present

## 2016-04-27 DIAGNOSIS — Z982 Presence of cerebrospinal fluid drainage device: Secondary | ICD-10-CM

## 2016-04-27 DIAGNOSIS — L89152 Pressure ulcer of sacral region, stage 2: Secondary | ICD-10-CM | POA: Diagnosis present

## 2016-04-27 DIAGNOSIS — Z79899 Other long term (current) drug therapy: Secondary | ICD-10-CM

## 2016-04-27 DIAGNOSIS — Z886 Allergy status to analgesic agent status: Secondary | ICD-10-CM

## 2016-04-27 DIAGNOSIS — E876 Hypokalemia: Secondary | ICD-10-CM | POA: Diagnosis not present

## 2016-04-27 DIAGNOSIS — N319 Neuromuscular dysfunction of bladder, unspecified: Secondary | ICD-10-CM | POA: Diagnosis present

## 2016-04-27 DIAGNOSIS — Q052 Lumbar spina bifida with hydrocephalus: Secondary | ICD-10-CM

## 2016-04-27 DIAGNOSIS — Z993 Dependence on wheelchair: Secondary | ICD-10-CM

## 2016-04-27 DIAGNOSIS — G8220 Paraplegia, unspecified: Secondary | ICD-10-CM | POA: Diagnosis present

## 2016-04-27 DIAGNOSIS — K830 Cholangitis: Secondary | ICD-10-CM | POA: Diagnosis present

## 2016-04-27 LAB — COMPLETE METABOLIC PANEL WITH GFR
ALT: 344 U/L — AB (ref 9–46)
AST: 171 U/L — AB (ref 10–40)
Albumin: 3.9 g/dL (ref 3.6–5.1)
Alkaline Phosphatase: 374 U/L — ABNORMAL HIGH (ref 40–115)
BUN: 13 mg/dL (ref 7–25)
CHLORIDE: 98 mmol/L (ref 98–110)
CO2: 21 mmol/L (ref 20–31)
CREATININE: 0.76 mg/dL (ref 0.60–1.35)
Calcium: 9.4 mg/dL (ref 8.6–10.3)
GFR, Est African American: >89 mL/min (ref >=60)
GFR, Est Non African American: >89 mL/min (ref >=60)
GLUCOSE: 72 mg/dL (ref 65–99)
Potassium: 4.2 mmol/L (ref 3.5–5.3)
SODIUM: 138 mmol/L (ref 135–146)
Total Bilirubin: 7.8 mg/dL — ABNORMAL HIGH (ref 0.2–1.2)
Total Protein: 7.7 g/dL (ref 6.1–8.1)

## 2016-04-27 LAB — CBC WITH DIFFERENTIAL/PLATELET
BASOS ABS: 0 10*3/uL (ref 0.0–0.1)
Basophils Relative: 0 %
EOS ABS: 0 10*3/uL (ref 0.0–0.7)
EOS PCT: 0 %
HCT: 45.8 % (ref 39.0–52.0)
HEMOGLOBIN: 15.4 g/dL (ref 13.0–17.0)
LYMPHS PCT: 6 %
Lymphs Abs: 0.8 10*3/uL (ref 0.7–4.0)
MCH: 29.7 pg (ref 26.0–34.0)
MCHC: 33.6 g/dL (ref 30.0–36.0)
MCV: 88.2 fL (ref 78.0–100.0)
Monocytes Absolute: 0.6 10*3/uL (ref 0.1–1.0)
Monocytes Relative: 4 %
NEUTROS PCT: 90 %
Neutro Abs: 12.8 10*3/uL — ABNORMAL HIGH (ref 1.7–7.7)
PLATELETS: 297 10*3/uL (ref 150–400)
RBC: 5.19 MIL/uL (ref 4.22–5.81)
RDW: 15 % (ref 11.5–15.5)
WBC: 14.2 10*3/uL — AB (ref 4.0–10.5)

## 2016-04-27 LAB — HEPATITIS PANEL, ACUTE
HCV AB: NEGATIVE
HEP B S AG: NEGATIVE
Hep A IgM: NONREACTIVE
Hep B C IgM: NONREACTIVE

## 2016-04-27 LAB — BASIC METABOLIC PANEL
ANION GAP: 15 (ref 5–15)
BUN: 11 mg/dL (ref 6–20)
CO2: 23 mmol/L (ref 22–32)
Calcium: 9.3 mg/dL (ref 8.9–10.3)
Chloride: 95 mmol/L — ABNORMAL LOW (ref 101–111)
Creatinine, Ser: 1.06 mg/dL (ref 0.61–1.24)
GFR calc Af Amer: >60 mL/min (ref >60)
GFR calc non Af Amer: >60 mL/min (ref >60)
Glucose, Bld: 140 mg/dL — ABNORMAL HIGH (ref 65–99)
POTASSIUM: 3.7 mmol/L (ref 3.5–5.1)
SODIUM: 133 mmol/L — AB (ref 135–145)

## 2016-04-27 NOTE — ED Triage Notes (Signed)
Spanish speaker pt c/o / epigastric pain states he feels bloated and very SOB, pt was saw on the ED yesterday and sent home on PO abx for a UTI, pt started taking Cephalexin today for the UTI, states abd pain is progressively worse and having increase SOB. Denies nausea or vomiting.

## 2016-04-27 NOTE — Telephone Encounter (Signed)
Patient's LFTs and Tbili were elevated, Precepted result with Dr. Lum BabeEniola who suggested to call patient and see if he was having abdominal pain. If he was she suggested he go to the the ED. If not abdominal pain she suggested that he follow up Monday.   Spanish interpreter used to call patient to discuss lab results. He states that he is not having anymore abdominal pain. Asked patient to please come into the clinic on Monday to be seen. He was agreeable and appreciative of the call.  Anders Simmondshristina Gambino, MD [facility], PGY-2

## 2016-04-27 NOTE — Telephone Encounter (Signed)
Result discussed with me. Seems he had amber color urine likely related to dehydration vs UTI. Currently on treatment for UTI. Culture pending.  As discussed with Dr. Jonathon JordanGambino, hepatitis panel elevated with elevated total billi. Hemoglobin normal, less likely hemolytic anemia. Hepatitis panel neg. ?? Obstructive jaundice. She is advised to contact patient about result. Assess for hemodynamic stability, if no fatigue, dizziness, weakness or abdominal pain, may closely follow up as outpatient otherwise will need ED assessment.  I also recommended liver U/S, repeat lab in 2 days. Discuss return precaution and refer to an hepatologist for outpatient liver work-up.  Dr. Jonathon JordanGambino agreed to contact patient with result discussion and management plan.

## 2016-04-28 ENCOUNTER — Emergency Department (HOSPITAL_COMMUNITY): Payer: Medicare Other

## 2016-04-28 ENCOUNTER — Inpatient Hospital Stay (HOSPITAL_COMMUNITY)
Admission: EM | Admit: 2016-04-28 | Discharge: 2016-05-06 | DRG: 418 | Disposition: A | Discharge status: Home / Self Care | Payer: Medicare Other | Attending: Family Medicine | Admitting: Family Medicine

## 2016-04-28 ENCOUNTER — Other Ambulatory Visit: Payer: Self-pay

## 2016-04-28 DIAGNOSIS — N1330 Unspecified hydronephrosis: Secondary | ICD-10-CM | POA: Diagnosis present

## 2016-04-28 DIAGNOSIS — L219 Seborrheic dermatitis, unspecified: Secondary | ICD-10-CM | POA: Diagnosis present

## 2016-04-28 DIAGNOSIS — Z982 Presence of cerebrospinal fluid drainage device: Secondary | ICD-10-CM | POA: Diagnosis not present

## 2016-04-28 DIAGNOSIS — B3749 Other urogenital candidiasis: Secondary | ICD-10-CM | POA: Diagnosis present

## 2016-04-28 DIAGNOSIS — K8031 Calculus of bile duct with cholangitis, unspecified, with obstruction: Secondary | ICD-10-CM | POA: Diagnosis not present

## 2016-04-28 DIAGNOSIS — E876 Hypokalemia: Secondary | ICD-10-CM | POA: Diagnosis not present

## 2016-04-28 DIAGNOSIS — R17 Unspecified jaundice: Secondary | ICD-10-CM | POA: Diagnosis not present

## 2016-04-28 DIAGNOSIS — Z886 Allergy status to analgesic agent status: Secondary | ICD-10-CM | POA: Diagnosis not present

## 2016-04-28 DIAGNOSIS — N319 Neuromuscular dysfunction of bladder, unspecified: Secondary | ICD-10-CM | POA: Diagnosis present

## 2016-04-28 DIAGNOSIS — Z419 Encounter for procedure for purposes other than remedying health state, unspecified: Secondary | ICD-10-CM

## 2016-04-28 DIAGNOSIS — R109 Unspecified abdominal pain: Secondary | ICD-10-CM | POA: Diagnosis present

## 2016-04-28 DIAGNOSIS — E860 Dehydration: Secondary | ICD-10-CM | POA: Diagnosis present

## 2016-04-28 DIAGNOSIS — K8063 Calculus of gallbladder and bile duct with acute cholecystitis with obstruction: Secondary | ICD-10-CM | POA: Diagnosis present

## 2016-04-28 DIAGNOSIS — K8309 Other cholangitis: Secondary | ICD-10-CM | POA: Diagnosis present

## 2016-04-28 DIAGNOSIS — G8220 Paraplegia, unspecified: Secondary | ICD-10-CM | POA: Diagnosis present

## 2016-04-28 DIAGNOSIS — R0682 Tachypnea, not elsewhere classified: Secondary | ICD-10-CM

## 2016-04-28 DIAGNOSIS — K819 Cholecystitis, unspecified: Secondary | ICD-10-CM

## 2016-04-28 DIAGNOSIS — K830 Cholangitis: Secondary | ICD-10-CM | POA: Diagnosis present

## 2016-04-28 DIAGNOSIS — R000 Tachycardia, unspecified: Secondary | ICD-10-CM

## 2016-04-28 DIAGNOSIS — Q052 Lumbar spina bifida with hydrocephalus: Secondary | ICD-10-CM | POA: Diagnosis not present

## 2016-04-28 DIAGNOSIS — K810 Acute cholecystitis: Secondary | ICD-10-CM | POA: Diagnosis present

## 2016-04-28 DIAGNOSIS — R509 Fever, unspecified: Secondary | ICD-10-CM

## 2016-04-28 DIAGNOSIS — Z79899 Other long term (current) drug therapy: Secondary | ICD-10-CM | POA: Diagnosis not present

## 2016-04-28 DIAGNOSIS — L89152 Pressure ulcer of sacral region, stage 2: Secondary | ICD-10-CM | POA: Diagnosis present

## 2016-04-28 DIAGNOSIS — N39498 Other specified urinary incontinence: Secondary | ICD-10-CM | POA: Diagnosis present

## 2016-04-28 DIAGNOSIS — Z993 Dependence on wheelchair: Secondary | ICD-10-CM | POA: Diagnosis not present

## 2016-04-28 DIAGNOSIS — R1011 Right upper quadrant pain: Secondary | ICD-10-CM | POA: Diagnosis not present

## 2016-04-28 DIAGNOSIS — R5082 Postprocedural fever: Secondary | ICD-10-CM | POA: Diagnosis not present

## 2016-04-28 LAB — URINALYSIS, ROUTINE W REFLEX MICROSCOPIC
BILIRUBIN URINE: NEGATIVE
GLUCOSE, UA: NEGATIVE mg/dL
KETONES UR: 5 mg/dL — AB
Nitrite: POSITIVE — AB
PROTEIN: NEGATIVE mg/dL
Specific Gravity, Urine: 1.016 (ref 1.005–1.030)
Squamous Epithelial / LPF: NONE SEEN
pH: 6 (ref 5.0–8.0)

## 2016-04-28 LAB — HEPATIC FUNCTION PANEL
ALT: 419 U/L — AB (ref 17–63)
AST: 257 U/L — AB (ref 15–41)
Albumin: 3.5 g/dL (ref 3.5–5.0)
Alkaline Phosphatase: 369 U/L — ABNORMAL HIGH (ref 38–126)
BILIRUBIN DIRECT: 6.4 mg/dL — AB (ref 0.1–0.5)
BILIRUBIN TOTAL: 10.3 mg/dL — AB (ref 0.3–1.2)
Indirect Bilirubin: 3.9 mg/dL — ABNORMAL HIGH (ref 0.3–0.9)
Total Protein: 8.2 g/dL — ABNORMAL HIGH (ref 6.5–8.1)

## 2016-04-28 LAB — I-STAT CG4 LACTIC ACID, ED: Lactic Acid, Venous: 0.84 mmol/L (ref 0.5–1.9)

## 2016-04-28 LAB — MRSA PCR SCREENING: MRSA by PCR: NEGATIVE

## 2016-04-28 LAB — LIPASE, BLOOD: Lipase: 56 U/L — ABNORMAL HIGH (ref 11–51)

## 2016-04-28 MED ORDER — SODIUM CHLORIDE 0.9 % IV BOLUS (SEPSIS)
1000.0000 mL | Freq: Once | INTRAVENOUS | Status: AC
  Administered 2016-04-28: 1000 mL via INTRAVENOUS

## 2016-04-28 MED ORDER — ACETAMINOPHEN 325 MG PO TABS
650.0000 mg | ORAL_TABLET | Freq: Four times a day (QID) | ORAL | Status: DC | PRN
  Administered 2016-05-02: 650 mg via ORAL
  Filled 2016-04-28: qty 2

## 2016-04-28 MED ORDER — HEPARIN SODIUM (PORCINE) 5000 UNIT/ML IJ SOLN
5000.0000 [IU] | Freq: Three times a day (TID) | INTRAMUSCULAR | Status: DC
  Administered 2016-04-28 – 2016-04-30 (×7): 5000 [IU] via SUBCUTANEOUS
  Filled 2016-04-28 (×5): qty 1

## 2016-04-28 MED ORDER — FENTANYL CITRATE (PF) 100 MCG/2ML IJ SOLN: 25.0000 ug | INTRAMUSCULAR | Status: DC | PRN

## 2016-04-28 MED ORDER — IOPAMIDOL (ISOVUE-300) INJECTION 61%
100.0000 mL | Freq: Once | INTRAVENOUS | Status: AC | PRN
  Administered 2016-04-28: 100 mL via INTRAVENOUS

## 2016-04-28 MED ORDER — VANCOMYCIN HCL IN DEXTROSE 1-5 GM/200ML-% IV SOLN
1000.0000 mg | Freq: Once | INTRAVENOUS | Status: AC
  Administered 2016-04-28: 1000 mg via INTRAVENOUS
  Filled 2016-04-28: qty 200

## 2016-04-28 MED ORDER — SODIUM CHLORIDE 0.9% FLUSH
3.0000 mL | Freq: Two times a day (BID) | INTRAVENOUS | Status: DC
  Administered 2016-04-28 – 2016-05-06 (×6): 3 mL via INTRAVENOUS

## 2016-04-28 MED ORDER — PIPERACILLIN-TAZOBACTAM 3.375 G IVPB
3.3750 g | Freq: Three times a day (TID) | INTRAVENOUS | Status: DC
  Administered 2016-04-28 – 2016-04-30 (×7): 3.375 g via INTRAVENOUS
  Filled 2016-04-28 (×8): qty 50

## 2016-04-28 MED ORDER — PIPERACILLIN-TAZOBACTAM 3.375 G IVPB 30 MIN: 3.3750 g | Freq: Three times a day (TID) | INTRAVENOUS | Status: DC

## 2016-04-28 MED ORDER — ACETAMINOPHEN 650 MG RE SUPP
650.0000 mg | Freq: Four times a day (QID) | RECTAL | Status: DC | PRN
Start: 2016-04-28 — End: 2016-05-02

## 2016-04-28 MED ORDER — CIPROFLOXACIN IN D5W 400 MG/200ML IV SOLN
400.0000 mg | Freq: Two times a day (BID) | INTRAVENOUS | Status: DC
  Administered 2016-04-28: 400 mg via INTRAVENOUS
  Filled 2016-04-28: qty 200

## 2016-04-28 MED ORDER — SODIUM CHLORIDE 0.9 % IV SOLN
INTRAVENOUS | Status: DC
  Administered 2016-04-28 – 2016-04-29 (×6): via INTRAVENOUS

## 2016-04-28 MED ORDER — PIPERACILLIN-TAZOBACTAM 3.375 G IVPB 30 MIN
3.3750 g | Freq: Once | INTRAVENOUS | Status: AC
  Administered 2016-04-28: 3.375 g via INTRAVENOUS
  Filled 2016-04-28: qty 50

## 2016-04-28 NOTE — H&P (Signed)
Family Medicine [facility] History and Physical Service Pager: [phone]  Patient name: Hector Shea Medical record number: [id number] Date of birth: May 22, 1990 Age: 26 y.o. Gender: male  Primary Care Provider: Uvaldo RisingFLETKE, KYLE, J, MD Consultants: Gen Surg Code Status: Full  Chief Complaint: Abdominal discomfort  Assessment and Plan: Hector Shea is a 26 y.o. male presenting with worsening abdominal pain. PMH is significant for spina bifida with hydrocephalus, ventricular shunt, and neurogenic bladder with incontinence.  #Acute Cholangitis: Upper quadrant pain. Hyperbilirubinemia to 10.3. AST/ALT 257/419 (worsening from 2 days ago). Leukocytosis to 14.2. CT with evidence of dilation of the biliary tree. Lipase only mildly elevated. These findings are diagnostic of cholangitis. Will continue to monitor for fevers and signs of sepsis.  - Admit to family med teaching service; attending physician is Dr. Lum BabeEniola - Consult to General Surgery by ED; appreciate recs - Consult to Gastroenterology >> will see - Will likely need MRCP or ERCP - Percutaneous decompression may be necessary - Start IV Cipro - DC Zosyn - DC Vanc - Abdom US >> pending - Trend Lactic acid - CMPs daily until stabilized.  - Blood Cx pending - NPO - Fentanyl as needed for pain (avoid morphine due to effects on Sphincter of Oddi)  #Tachycardia: 120s to 150s. Likely secondary to pain. Possible indication of worsening status. - Control pain; fentanyl every 2 hours as needed - IV NS at 150 mL/hour  #Neurogenic bladder with incontinence: 2/2 spina bifida. In and out cath [facility] to anticipation for possible procedures.    FEN/GI: NS IV @150ml /hr; NPO Prophylaxis: SQ hep  Disposition: pending  History of Present Illness:  Hector Shea is a 26 y.o. male presenting with worsening abdominal pain. He states that the severe pain began  approximately 2 days ago. He has not had similar issues in the past. Pain is located along the upper right quadrant. He denies any association with food. Denies any trauma. He has not had any nausea or vomiting. No diarrhea or constipation. Pain has been steadily worsening since he first appreciated. He denies any objective fevers but his family states that he has been feeling warm recently.  Patient was noted to have elevated liver enzymes and bilirubin after a visit with his primary care physician's office earlier this week. Physician's office encouraged him to go to the ED, which is what brought him here today. Repeat labs show worsening bilirubin and LFTs.  Review Of Systems: Per HPI   ROS  Patient Active Problem List   Diagnosis Date Noted  . 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 Abnormal urine 04/26/2016  . Preventative health care 10/18/2014  . Presence of ventricular shunt 07/27/2012  . NYSTAGMUS 11/13/2009  . PARAPARESIS 09/11/2009  . 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 Urinary incontinence without sensory awareness 09/11/2009    Past Medical History: Past Medical History:  Diagnosis Date  . Neurogenic bladder   . Spina bifida [facility])     Past Surgical History: Past Surgical History:  Procedure Laterality Date  . ANKLE SURGERY  2001  . mass removed right leg  1998  . MENINGOCELE REPAIR  09/1991   [place]  . SACRAL DECUBITUS ULCER EXCISION  2007  . VENTRICULOPERITONEAL SHUNT  1993    Social History: Social History  Substance Use Topics  . Smoking status: Never Smoker  . Smokeless tobacco: Never Used  . Alcohol use No  Additional social history: none  Please also refer to relevant sections of EMR.  Family History: Family History  Problem Relation Age of Onset  . Hypertension Mother   . Hyperlipidemia Mother   . Diabetes Father     on renal dialysis   Allergies and Medications: Allergies  Allergen Reactions  . Advil  [Ibuprofen]    No current facility-administered medications on file prior to encounter.    Current Outpatient Prescriptions on File Prior to Encounter  Medication Sig Dispense Refill  . oxybutynin (DITROPAN) 5 MG tablet Take 2 tablets in the morning, 1 tablet at lunch, and 2 tablets at night for bladder spasm. (Patient taking differently: Take 5-10 mg by mouth See admin instructions. Take 2 tablets in the morning, 1 tablet at lunch, and 2 tablets at night for bladder spasm.) 270 tablet 1    Objective: BP 129/86   Pulse (!) 127   Temp 98.6 F (37 C) (Oral)   Resp 21   Ht 5' (1.524 m)   Wt 200 lb (90.7 kg)   SpO2 98%   BMI 39.06 kg/m  Exam: General -- oriented, pleasant and cooperative. Appears uncomfortable but NAD. HEENT -- Head is normocephalic. PERRLA. EOMI. Scleral icterus present. Ears, nose and throat were benign. MMM Neck -- supple; no bruits. Integument -- intact. No rash, erythema, or ecchymoses.  Chest -- good expansion. Lungs clear to auscultation. Cardiac -- RRR. No murmurs noted.  Abdomen -- soft, nondistended, significant tenderness over the right lower quadrant with some guarding. No rebound. No masses palpable. Bowel sounds present. CNS -- alert and oriented to person place and time. Extremeties - at their baseline, patient has spina bifida. Chronic deficits unchanged. Dorsalis pedis pulses present and symmetrical.    Labs and Imaging: CBC BMET   Recent Labs Lab 04/27/16 2120  WBC 14.2*  HGB 15.4  HCT 45.8  PLT 297    Recent Labs Lab 04/27/16 2120  NA 133*  K 3.7  CL 95*  CO2 23  BUN 11  CREATININE 1.06  GLUCOSE 140*  CALCIUM 9.3       Kathee Delton, MD 04/28/2016, 6:08 AM PGY-3, [facility] FPTS Intern pager: [phone], text pages welcome

## 2016-04-28 NOTE — Progress Notes (Signed)
Pharmacy Antibiotic Note  Hector Shea is a 26 y.o. male admitted on 04/28/2016 with cholangitis.  Pharmacy has been consulted for Ciprofloxacin dosing.  Zosyn 3.375gm and Vanc 1gm IV ~0300 in ED  Plan: Ciprofloxacin 400mg  IV q12h Will f/u renal function, micro data, and pt's clinical condition  Height: 5' (152.4 cm) Weight: 138 lb 14.2 oz (63 kg) IBW/kg (Calculated) : 50  Temp (24hrs), Avg:98.6 F (37 C), Min:98.6 F (37 C), Max:98.6 F (37 C)   Recent Labs Lab 04/26/16 1130 04/27/16 2120 04/28/16 0118  WBC 6.5 14.2*  --   CREATININE 0.76 1.06  --   LATICACIDVEN  --   --  0.84    Estimated Creatinine Clearance: 83.9 mL/min (by C-G formula based on SCr of 1.06 mg/dL).    Allergies  Allergen Reactions  . Advil [Ibuprofen]     Antimicrobials this admission: 1/28 Vanc x 1 1/28 Zosyn x1 1/28 Cipro >>   Microbiology results: 1/28 BCx x2:  1/28 MRSA PCR:   Thank you for allowing pharmacy to be a part of this patient's care.  [name], PharmD, BCPS Clinical pharmacist, pager [phone] 04/28/2016 6:54 AM

## 2016-04-28 NOTE — Consult Note (Signed)
Reason for Consult: abdominal pain Referring Physician: Barth, Trella is an 26 y.o. male.  HPI: 26 yo male with 2 day history of abdominal pain. He has not had similar attacks in the past. He denies nausea or vomiting. He has fevers and chills. He denies diarrhea or constipation. The pain has been getting worse since it began. He also noted decreased urine output from his intermittent cathing.  Past Medical History:  Diagnosis Date  . Neurogenic bladder   . Spina bifida [facility])     Past Surgical History:  Procedure Laterality Date  . ANKLE SURGERY  2001  . mass removed right leg  1998  . MENINGOCELE REPAIR  Aug 13, 1991   In [place], no details  . SACRAL DECUBITUS ULCER EXCISION  2007  . VENTRICULOPERITONEAL SHUNT  1993    Family History  Problem Relation Age of Onset  . Hypertension Mother   . Hyperlipidemia Mother   . Diabetes Father     on renal dialysis    Social History:  reports that he has never smoked. He has never used smokeless tobacco. He reports that he does not drink alcohol or use drugs.  Allergies:  Allergies  Allergen Reactions  . Advil [Ibuprofen]     Medications: I have reviewed the patient's current medications.  Results for orders placed or performed during the hospital encounter of 04/28/16 (from the past 48 hour(s))  CBC with Differential     Status: Abnormal   Collection Time: 04/27/16  9:20 PM  Result Value Ref Range   WBC 14.2 (H) 4.0 - 10.5 K/uL   RBC 5.19 4.22 - 5.81 MIL/uL   Hemoglobin 15.4 13.0 - 17.0 g/dL   HCT 45.8 39.0 - 52.0 %   MCV 88.2 78.0 - 100.0 fL   MCH 29.7 26.0 - 34.0 pg   MCHC 33.6 30.0 - 36.0 g/dL   RDW 15.0 11.5 - 15.5 %   Platelets 297 150 - 400 K/uL   Neutrophils Relative % 90 %   Neutro Abs 12.8 (H) 1.7 - 7.7 K/uL   Lymphocytes Relative 6 %   Lymphs Abs 0.8 0.7 - 4.0 K/uL   Monocytes Relative 4 %   Monocytes Absolute 0.6 0.1 - 1.0 K/uL   Eosinophils Relative 0 %   Eosinophils Absolute 0.0 0.0 -  0.7 K/uL   Basophils Relative 0 %   Basophils Absolute 0.0 0.0 - 0.1 K/uL  Basic metabolic panel     Status: Abnormal   Collection Time: 04/27/16  9:20 PM  Result Value Ref Range   Sodium 133 (L) 135 - 145 mmol/L   Potassium 3.7 3.5 - 5.1 mmol/L   Chloride 95 (L) 101 - 111 mmol/L   CO2 23 22 - 32 mmol/L   Glucose, Bld 140 (H) 65 - 99 mg/dL   BUN 11 6 - 20 mg/dL   Creatinine, Ser 1.06 0.61 - 1.24 mg/dL   Calcium 9.3 8.9 - 10.3 mg/dL   GFR calc non Af Amer >60 >60 mL/min   GFR calc Af Amer >60 >60 mL/min    Comment: (NOTE) The eGFR has been calculated using the CKD EPI equation. This calculation has not been validated in all clinical situations. eGFR's persistently <60 mL/min signify possible Chronic Kidney Disease.    Anion gap 15 5 - 15  Hepatic function panel     Status: Abnormal   Collection Time: 04/28/16  1:03 AM  Result Value Ref Range   Total Protein 8.2 (  H) 6.5 - 8.1 g/dL   Albumin 3.5 3.5 - 5.0 g/dL   AST 257 (H) 15 - 41 U/L   ALT 419 (H) 17 - 63 U/L   Alkaline Phosphatase 369 (H) 38 - 126 U/L   Total Bilirubin 10.3 (H) 0.3 - 1.2 mg/dL   Bilirubin, Direct 6.4 (H) 0.1 - 0.5 mg/dL   Indirect Bilirubin 3.9 (H) 0.3 - 0.9 mg/dL  Lipase, blood     Status: Abnormal   Collection Time: 04/28/16  1:03 AM  Result Value Ref Range   Lipase 56 (H) 11 - 51 U/L  I-Stat CG4 Lactic Acid, ED     Status: None   Collection Time: 04/28/16  1:18 AM  Result Value Ref Range   Lactic Acid, Venous 0.84 0.5 - 1.9 mmol/L  Urinalysis, Routine w reflex microscopic     Status: Abnormal   Collection Time: 04/28/16  3:45 AM  Result Value Ref Range   Color, Urine AMBER (A) YELLOW    Comment: BIOCHEMICALS MAY BE AFFECTED BY COLOR   APPearance HAZY (A) CLEAR   Specific Gravity, Urine 1.016 1.005 - 1.030   pH 6.0 5.0 - 8.0   Glucose, UA NEGATIVE NEGATIVE mg/dL   Hgb urine dipstick MODERATE (A) NEGATIVE   Bilirubin Urine NEGATIVE NEGATIVE   Ketones, ur 5 (A) NEGATIVE mg/dL   Protein, ur  NEGATIVE NEGATIVE mg/dL   Nitrite POSITIVE (A) NEGATIVE   Leukocytes, UA LARGE (A) NEGATIVE   RBC / HPF 0-5 0 - 5 RBC/hpf   WBC, UA TOO NUMEROUS TO COUNT 0 - 5 WBC/hpf   Bacteria, UA MANY (A) NONE SEEN   Squamous Epithelial / LPF NONE SEEN NONE SEEN   WBC Clumps PRESENT     Dg Chest 2 View  Result Date: 04/27/2016 CLINICAL DATA:  Epigastric pain which shortness-of-breath. Possible UTI on cephalexin. Worsening abdominal pain. EXAM: CHEST  2 VIEW COMPARISON:  None. FINDINGS: Right-sided ventriculoperitoneal shunt with irregularity and discontinuity of the tubing over the right flank. Lungs are somewhat hypoinflated with mild prominence of the perihilar markings. No focal airspace consolidation or effusion. Cardiomediastinal silhouette is within normal. Mild gastric distension. IMPRESSION: Minimal prominence of the perihilar markings which may be due to the degree of hypoinflation, although cannot exclude viral bronchopneumonia or minimal vascular congestion. Right-sided ventriculoperitoneal shunt with focal irregularity/ discontinuity of the shunt over the lateral thorax. Electronically Signed   By: Marin Olp M.D.   On: 04/27/2016 21:55   Ct Abdomen Pelvis W Contrast  Result Date: 04/28/2016 CLINICAL DATA:  26 year old male with hyperbilirubinemia and diffuse abdominal pain. EXAM: CT ABDOMEN AND PELVIS WITH CONTRAST TECHNIQUE: Multidetector CT imaging of the abdomen and pelvis was performed using the standard protocol following bolus administration of intravenous contrast. CONTRAST:  140m ISOVUE-300 IOPAMIDOL (ISOVUE-300) INJECTION 61% COMPARISON:  None. FINDINGS: Lower chest: Small right lung base subpleural density most likely represents atelectatic changes. Infiltrate is less likely but not entirely excluded. Clinical correlation is recommended. Left lung base atelectatic changes noted. No intra-abdominal free air or free fluid. Hepatobiliary: The gallbladder is distended. No calcified stone  identified. There is noncalcified stone versus sludge within the gallbladder lumen. Noncalcified debris suspected within the midportion of the common bile duct as well as within the central CBD at the head of the pancreas adjacent to the ampulla. There is dilatation of the CBD measuring up to 16 mm. There is also dilatation of the intrahepatic biliary ducts. There is thickened appearance of the gallbladder wall. There  is a 4.0 x 1.0 cm inflammatory collection adjacent to the gallbladder in the subhepatic area concerning for an abscess. The liver is unremarkable. Pancreas: Unremarkable. No pancreatic ductal dilatation or surrounding inflammatory changes. Spleen: Normal in size without focal abnormality. Adrenals/Urinary Tract: The adrenal glands appear unremarkable. The right kidney is atrophic. There is chronic mild dilatation of the right renal collecting system and right ureter. 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 There is mild left hydronephrosis. No obstructing stone identified. Correlation with urinalysis recommended to exclude acute UTI. The urinary bladder is unremarkable. Stomach/Bowel: There is no evidence of bowel obstruction or active inflammation. Normal appendix. Vascular/Lymphatic: The abdominal aorta and IVC appear unremarkable. The SMV, splenic vein, and main portal vein are patent. No portal venous gas identified. There is no adenopathy. Reproductive: The prostate and seminal vesicles are grossly unremarkable. Other: Right anterior chest wall VP shunt terminates in the subcutaneous soft tissues of the anterior abdominal wall. Right anterior pelvic wall scar. No fluid collection. Musculoskeletal: Chronic dislocation of the hips. Non fusion of the lumbar posterior elements compatible with spina bifida. There is protrusion and exposure of the CSF field thecal sac compatible with a  meningocele. No acute fracture. Postsurgical changes of meningocele repair in the lower back. IMPRESSION: Dilatation of the intrahepatic and extrahepatic biliary tree as well as distended and inflamed gallbladder likely related to obstruction of the CBD with findings of cholecystitis and a small pericholecystic abscess. Associated acute cholangitis is not entirely excluded. Correlation with clinical exam and further evaluation with MRCP recommended to better evaluate for the gallbladder and CBD contents. Mild left hydronephrosis. Right renal atrophy with chronic appearing dilatation of the right renal collecting system and ureter. Correlation with urinalysis recommended to exclude acute UTI. 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 These results were called by telephone at the time of interpretation on 04/28/2016 at 4:12 am to physician assistant Curtis , who verbally acknowledged these results. Electronically Signed   By: Anner Crete M.D.   On: 04/28/2016 04:18    Review of Systems  Constitutional: Positive for chills and fever.  HENT: Negative for hearing loss.   Eyes: Negative for blurred vision and double vision.  Respiratory: Negative for cough and hemoptysis.   Cardiovascular: Negative for chest pain and palpitations.  Gastrointestinal: Positive for abdominal pain. Negative for nausea and vomiting.  Genitourinary: Negative for dysuria, frequency and urgency.  Musculoskeletal: Negative for myalgias and neck pain.  Skin: Negative for itching and rash.  Neurological: Negative for dizziness, tingling and headaches.  Endo/Heme/Allergies: Does not bruise/bleed easily.  Psychiatric/Behavioral: Negative for depression and suicidal ideas.   Blood pressure 138/98, pulse 117, temperature 98.6 F (37 C), temperature source Oral, resp. rate (!) 30, height 5' (1.524 m), weight 90.7 kg (200 lb), SpO2 99 %. Physical Exam  Vitals reviewed. Constitutional: He is oriented  to person, place, and time. He appears well-developed and well-nourished.  HENT:  Head: Normocephalic and atraumatic.  Eyes: Conjunctivae and EOM are normal. Pupils are equal, round, and reactive to light.  Neck: Normal range of motion. Neck supple.  Cardiovascular: Normal rate and regular rhythm.   Respiratory: Effort normal and breath sounds normal.  GI: Soft. Bowel sounds are normal. He exhibits no distension. There is tenderness in the right upper quadrant.  Right mid abdominal scar  Musculoskeletal: Normal range of motion.  Bilateral lower extremities with poor  muscle definition and smaller than expected bony structures  Neurological: He is alert and oriented to person, place, and time.  Skin: Skin is warm and dry.  Psychiatric: He has a normal mood and affect. His behavior is normal.    Assessment/Plan: 26 yo male with evidence of obstructive jaundice and a dilated gallbladder with concern for fluid within the liver around the dome of the gallbladder. At this time I think the common bile duct obstruction is the largest issue consistent with obstructive cholangitis with evidence of SIRS.  -I recommend stat GI consult for possible decompression -broad spectrum ABX -close monitoring -we will continue to follow along. Due to the changes around the gallbladder, he will probably need a cholecystectomy soon, but think decompressing the biliary tree is the biggest priority  Spina bifida with meningocele repair, wheelchair bound and history of VP shunt with scar overlying the right upper abdomen. Currently the shunt tubing terminates in the subcutaneous tissue of the right upper abdomen and has no intraperitoneal domain.  Arta Bruce  04/28/2016, 4:28 AM

## 2016-04-28 NOTE — ED Notes (Signed)
Through a translator, pt is not feeling well with stomach pains. Pt was just seen at his family doctor on Friday and diagnosed with a UTI. Pt was given an antibiotic and he has taken all appropriate doses. Pt also received a call from his PCP on Saturday that he has an inflamed liver.

## 2016-04-28 NOTE — Progress Notes (Signed)
Pharmacy Antibiotic Note  Hector BottomJuan Manuel Alden HippVarela Shea is a 26 y.o. male admitted on 04/28/2016 with cholangitis.  Pharmacy has been consulted for Zosyn dosing. Zosyn 3.375gm and Vanc 1gm IV ~0300 in ED. Cipro was also briefly started by family medicine team, but GI broadened back to zosyn to cover for anaerobes.   Plan: Zosyn 3.375g Q8H Will f/u renal function, micro data, and pt's clinical condition  Height: 5' (152.4 cm) Weight: 138 lb 14.2 oz (63 kg) IBW/kg (Calculated) : 50  Temp (24hrs), Avg:99 F (37.2 C), Min:98.6 F (37 C), Max:99.8 F (37.7 C)   Recent Labs Lab 04/26/16 1130 04/27/16 2120 04/28/16 0118  WBC 6.5 14.2*  --   CREATININE 0.76 1.06  --   LATICACIDVEN  --   --  0.84    Estimated Creatinine Clearance: 83.9 mL/min (by C-G formula based on SCr of 1.06 mg/dL).    Allergies  Allergen Reactions  . Advil [Ibuprofen]     Antimicrobials this admission: 1/28 Vanc x 1 1/28 Zosyn >> 1/28 Cipro x1  Microbiology results: 1/28 BCx x2:  1/28 MRSA PCR: negative  Thank you for allowing pharmacy to be a part of this patient's care.  Hector Shea(Hector Shea), PharmD  PGY1 Pharmacy Resident Pager: [phone] 04/28/2016 10:22 AM

## 2016-04-28 NOTE — ED Provider Notes (Signed)
[facility]-EMERGENCY DEPT Provider Note   CSN: [id number] Arrival date & time: 04/27/16  2038     History   Chief Complaint Chief Complaint  Patient presents with  . Shortness of Breath  . Abdominal Pain    HPI Hector Shea is a 26 y.o. male.  Patient with a history of spina bifida presents for general malaise stating "I feel really, really bad" and for evaluation of abdominal pain. No vomiting, diarrhea or known fever. He was seen by his doctor on Friday (2 days ago) and diagnosed with a UTI for which he was given antibiotics. He reports he received a call from his doctor yesterday stating that his liver functions were very abnormal and urged him to come to the emergency department. No ruinary symptoms, back pain or flank pain.   The history is provided by the patient. A language interpreter was used (Family member acting as interpreter).    Past Medical History:  Diagnosis Date  . Neurogenic bladder   . Spina bifida [facility])     Patient Active Problem List   Diagnosis Date Noted  . Abnormal urine 04/26/2016  . Preventative health care 10/18/2014  . Presence of ventricular shunt 07/27/2012  . NYSTAGMUS 11/13/2009  . PARAPARESIS 09/11/2009  . 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 Urinary incontinence without sensory awareness 09/11/2009    Past Surgical History:  Procedure Laterality Date  . ANKLE SURGERY  2001  . mass removed right leg  1998  . MENINGOCELE REPAIR  18-Jun-1991   In [place], no details  . SACRAL DECUBITUS ULCER EXCISION  2007  . VENTRICULOPERITONEAL SHUNT  1993       Home Medications    Prior to Admission medications   Medication Sig Start Date End Date Taking? Authorizing Provider  cephALEXin (KEFLEX) 500 MG capsule Take 1 capsule (500 mg total) by mouth 2 (two) times daily. 04/26/16 05/03/16  Beaulah Dinning, MD  oxybutynin (DITROPAN) 5 MG tablet Take 2 tablets in the morning, 1 tablet at lunch, and 2 tablets at night  for bladder spasm. 10/04/13   [name], MD    Family History Family History  Problem Relation Age of Onset  . Hypertension Mother   . Hyperlipidemia Mother   . Diabetes Father     on renal dialysis    Social History Social History  Substance Use Topics  . Smoking status: Never Smoker  . Smokeless tobacco: Never Used  . Alcohol use No     Allergies   Advil [ibuprofen]   Review of Systems Review of Systems  Constitutional: Positive for fatigue. Negative for chills and fever.  HENT: Negative.   Respiratory: Negative.  Negative for shortness of breath.   Cardiovascular: Negative.  Negative for chest pain.  Gastrointestinal: Positive for abdominal pain. Negative for diarrhea, nausea and vomiting.  Genitourinary: Negative for dysuria and flank pain.  Musculoskeletal: Negative.  Negative for myalgias.  Skin: Positive for color change.  Neurological: Positive for weakness. Negative for light-headedness.     Physical Exam Updated Vital Signs BP 150/100 (BP Location: Left Arm)   Pulse (!) 141   Temp 98.6 F (37 C) (Oral)   Resp 20   Ht 5' (1.524 m)   Wt 90.7 kg   SpO2 96%   BMI 39.06 kg/m   Physical Exam  Constitutional: He is oriented to person, place, and time. He appears well-developed and well-nourished. No distress.  HENT:  Head: Normocephalic.  Eyes: Scleral icterus  is present.  Neck: Normal range of motion. Neck supple.  Cardiovascular: Regular rhythm.  Tachycardia present.   Pulmonary/Chest: Effort normal and breath sounds normal. He has no wheezes. He has no rales.  Abdominal: Soft. Bowel sounds are normal. There is tenderness (Diffuse tenderness. ). There is no rebound and no guarding.  Musculoskeletal: Normal range of motion.  Bilateral congenital LE deformity.   Neurological: He is alert and oriented to person, place, and time.  Skin: Skin is warm and dry. No rash noted.  Skin has yellow appearance.   Psychiatric: He has a normal mood and affect.      ED Treatments / Results  Labs (all labs ordered are listed, but only abnormal results are displayed) Labs Reviewed  CBC WITH DIFFERENTIAL/PLATELET - Abnormal; Notable for the following:       Result Value   WBC 14.2 (*)    Neutro Abs 12.8 (*)    All other components within normal limits  BASIC METABOLIC PANEL - Abnormal; Notable for the following:    Sodium 133 (*)    Chloride 95 (*)    Glucose, Bld 140 (*)    All other components within normal limits  HEPATIC FUNCTION PANEL  LIPASE, BLOOD  I-STAT TROPOININ, ED  I-STAT CG4 LACTIC ACID, ED    EKG  EKG Interpretation  Date/Time:  Sunday April 28 2016 01:13:14 EST Ventricular Rate:  141 PR Interval:    QRS Duration: 87 QT Interval:  288 QTC Calculation: 441 R Axis:   103 Text Interpretation:  Sinus tachycardia LAE, consider biatrial enlargement Borderline right axis deviation Abnormal ekg Confirmed by Bebe ShaggyWICKLINE  MD, DONALD ([id number]) on 04/28/2016 1:21:50 AM       Radiology Dg Chest 2 View  Result Date: 04/27/2016 CLINICAL DATA:  Epigastric pain which shortness-of-breath. Possible UTI on cephalexin. Worsening abdominal pain. EXAM: CHEST  2 VIEW COMPARISON:  None. FINDINGS: Right-sided ventriculoperitoneal shunt with irregularity and discontinuity of the tubing over the right flank. Lungs are somewhat hypoinflated with mild prominence of the perihilar markings. No focal airspace consolidation or effusion. Cardiomediastinal silhouette is within normal. Mild gastric distension. IMPRESSION: Minimal prominence of the perihilar markings which may be due to the degree of hypoinflation, although cannot exclude viral bronchopneumonia or minimal vascular congestion. Right-sided ventriculoperitoneal shunt with focal irregularity/ discontinuity of the shunt over the lateral thorax. Electronically Signed   By: Elberta Fortisaniel  Boyle M.D.   On: 04/27/2016 21:55    Procedures Procedures (including critical care time)  Medications Ordered in  ED Medications - No data to display   Initial Impression / Assessment and Plan / ED Course  I have reviewed the triage vital signs and the nursing notes.  Pertinent labs & imaging results that were available during my care of the patient were reviewed by me and considered in my medical decision making (see chart for details).     Patient here for evaluation of abnormal liver functions as found by PCP on exam last week, and for discoloration of skin and eyes c/w jaundice. He is not feeling well. No vomiting of fever. He is taking abx for UTI but because of neurogenic bladder without sensory awareness, he did not have symptoms prior to diagnosis.   The patient is jaundiced. His liver functions are worsening when compared to labs done on 04/26/16 with total bilirubin of 10.3 compared to 7.8 2 days ago; AST 257 when compared to 171; ALT 419 compared to 344. He has a mild leukocytosis. Urine remains nitrite positive.  The patient underwent CT scan and this shows significant edema associated with the gall bladder. There is intra and extra hepatic ductal dilatation. There is a pericholecystic abscess. Discussed with general surgery, Dr. Sheliah Hatch, who will provide surgical consultation. Discussed with Dr. Wende Mott with [facility] who accepts the patient for admission.   The patient has been comfortable on multiple rechecks. He is receiving IVF's, He remains significantly tachycardic but no instability and he continues to be awake and alert.   CRITICAL CARE Performed by: Elpidio Anis A   Total critical care time: 40 minutes  Critical care time was exclusive of separately billable procedures and treating other patients.  Critical care was necessary to treat or prevent imminent or life-threatening deterioration.  Critical care was time spent personally by me on the following activities: development of treatment plan with patient and/or surrogate as well as nursing, discussions with consultants,  evaluation of patient's response to treatment, examination of patient, obtaining history from patient or surrogate, ordering and performing treatments and interventions, ordering and review of laboratory studies, ordering and review of radiographic studies, pulse oximetry and re-evaluation of patient's condition.   Final Clinical Impressions(s) / ED Diagnoses   Final diagnoses:  None   1. Pericholecystic abscess 2. Jaundice 3. Tachycardia, persistent  New Prescriptions New Prescriptions   No medications on file     Elpidio Anis, Cordelia Poche 04/28/16 [id number]    Zadie Rhine, MD 04/28/16 [phone]

## 2016-04-28 NOTE — ED Provider Notes (Signed)
Patient seen/examined in the Emergency Department in conjunction with Midlevel Provider [place] Patient reports diffuse abdominal pain.  He denies other complaints, including headache, chest pain.  Denies any wounds on legs/buttocks (pt is wheelchair bound)  Exam : awake/alert, no distress, smiling, diffuse abdominal tenderness Plan: due to lab abnormality and vital signs, CT imaging ordered Also given IV fluids and IV antibiotics (though lactate normal)    Zadie Rhineonald Pa Tennant, MD 04/28/16 [phone]

## 2016-04-28 NOTE — Consult Note (Signed)
                                                                           [facility] Consult: 8:34 AM 04/28/2016  LOS: 0 days    Referring Provider: Dr Eniola, teaching service  Primary Care Physician:  FLETKE, KYLE, J, MD Primary Gastroenterologist:  unassigned   Seen in consult for Dr Patrick Hung Reason for Consultation:  Cholangitis   HPI: Hector Shea is a 26 y.o. male.  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  Wheelchair bound.  Neurogenic bladder requires self catheterization.  Hx sacral decubitus surgery.  Seborrheic dermatitis.   Admitted this AM with 2 days abd pain, fevers, chills.  No N/V.  +oliguria.  Jaundiced.  Meets SIRS criteria.  CT ab/pelvis: Intra/extra hepatic bile ducts dilated.  Acute cholecystitis , small pericholecystic abscess.  Left hydronephrosis.  Chronic dilation right renal collecting system.  Spina bifida and meningocele.  Chronic bilateral dislocated hips. Abd ultrasound: Cholelithiasis, 12 mm CBD.  Fluid in hepatic parenchyma.  Heterogenous liver.     Labs with elevated LFTs, cholestatic pattern, T bili 7.8 >> 10.3.  WBCs 6.5 >> 14.2.   Acute hepatitis serologies Negative.   Pt's pain is gone (scores 0 instead of 8-10) this AM after doses of Vanc, Cipro, Zosyn.  He feels better.  Denies n/v.  No previous issues with digestive tract or with abd pain.  No NSAIDs, ETOH.  Lives with his parents in [place].  Brother in room with pt, both speak English.    Past Medical History:  Diagnosis Date  . Neurogenic bladder   . Spina bifida (HCC)     Past Surgical History:  Procedure Laterality Date  . ANKLE SURGERY  2001  . mass removed right leg  1998  . MENINGOCELE REPAIR  09/1991   In [place], no details  . SACRAL DECUBITUS ULCER EXCISION  2007  . VENTRICULOPERITONEAL SHUNT  1993    Prior to  Admission medications   Medication Sig Start Date End Date Taking? Authorizing Provider  oxybutynin (DITROPAN) 5 MG tablet Take 2 tablets in the morning, 1 tablet at lunch, and 2 tablets at night for bladder spasm. Patient taking differently: Take 5-10 mg by mouth See admin instructions. Take 2 tablets in the morning, 1 tablet at lunch, and 2 tablets at night for bladder spasm. 10/04/13  Yes Kyle J Fletke, MD    Scheduled Meds: . ciprofloxacin  400 mg Intravenous Q12H  . heparin  5,000 Units Subcutaneous Q8H  . sodium chloride flush  3 mL Intravenous Q12H   Infusions: . sodium chloride 150 mL/hr at 04/28/16 0733   PRN Meds: acetaminophen **OR** acetaminophen, fentaNYL (SUBLIMAZE) injection   Allergies as of 04/27/2016 - Review Complete 04/27/2016  Allergen Reaction Noted  . Advil [ibuprofen]  02/04/2011    Family History  Problem Relation Age of Onset  . Hypertension Mother   . Hyperlipidemia Mother   . Diabetes Father     on renal dialysis    Social History   Social History  . Marital status: Single    Spouse name: N/A  . Number of children: N/A  . Years of   education: 7   Occupational History  . Not on file.   Social History Main Topics  . Smoking status: Never Smoker  . Smokeless tobacco: Never Used  . Alcohol use No  . Drug use: No  . Sexual activity: Not on file   Other Topics Concern  . Not on file   Social History Narrative   Moved to [place] From [place] with his mother to join his father and brothers in [place]   Six years of education in [place]   Currently a student in newcomers classes    REVIEW OF SYSTEMS: Constitutional:  Generally spends day in a wheelchair.  No weakness or fatigue ENT:  No nose bleeds Pulm:  No SOB or cough CV:  No palpitations, no LE edema.  GU:  No hematuria, no frequency GI:  Per HPI Heme:  No unusual bleeding or bruising   Transfusions:  None per his/family recall Neuro:  No headaches, no peripheral tingling or  numbness Derm:  No itching, no rash or sores.  Endocrine:  No sweats or chills.  No polyuria or dysuria Immunization:  Did not query    PHYSICAL EXAM: Vital signs in last 24 hours: Vitals:   04/28/16 0530 04/28/16 0648  BP: 129/86 126/82  Pulse: (!) 127   Resp: 21 18  Temp:  99.8 F (37.7 C)   Wt Readings from Last 3 Encounters:  04/28/16 63 kg (138 lb 14.2 oz)  09/11/09 68.5 kg (151 lb) (56 %, Z= 0.14)*   * Growth percentiles are based on CDC 2-20 Years data.    General: Pleasant, alert. Diaphoretic but does not look acutely ill. He is comfortable Head:  No facial asymmetry or swelling.  Eyes:  Slight scleral icterus, no conjunctival pallor. EOMI. Ears:  Not hard of hearing  Nose:  No discharge or congestion Mouth:  Clear, moist, pink oral mucosa Neck:  No JVD, no masses. Lungs:  Clear bilaterally. No cough or dyspnea Heart: Slightly tachycardia in the 1 teens, regular. S1-S2 audible. No murmurs rubs or gallops. Abdomen:  Soft. Surgical incision scar in right upper quadrant. No masses, no HSM. Active bowel sounds. Not tender. Distended.   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  Knee immobilizer type splint on the right leg. Extremities:  No CCE.  Neurologic:  Patient is alert and oriented. No tremors. Did not test limb movement or strength. Skin:  Difficult to discern jaundice due to his all of colored skin. Tattoos:  None   Psych:  Calm, cooperative, pleasant affect.  Intake/Output from previous day: 01/27 0701 - 01/28 0700 In: 2250 [IV Piggyback:2250] Out: -  Intake/Output this shift: No intake/output data recorded.  LAB RESULTS:  Recent Labs  04/26/16 1130 04/27/16 2120  WBC 6.5 14.2*  HGB 15.7 15.4  HCT 47.4 45.8  PLT 181 297   BMET Lab Results  Component Value Date   NA 133 (L) 04/27/2016   NA 138 04/26/2016   NA 141 11/13/2009   K 3.7 04/27/2016   K 4.2 04/26/2016   K 4.4 11/13/2009   CL 95  (L) 04/27/2016   CL 98 04/26/2016   CL 106 11/13/2009   CO2 23 04/27/2016   CO2 21 04/26/2016   CO2 24 11/13/2009   GLUCOSE 140 (H) 04/27/2016   GLUCOSE 72 04/26/2016   GLUCOSE 88 11/13/2009   BUN 11 04/27/2016   BUN 13 04/26/2016   BUN 21 11/13/2009   CREATININE 1.06 04/27/2016     CREATININE 0.76 04/26/2016   CREATININE 1.12 11/13/2009   CALCIUM 9.3 04/27/2016   CALCIUM 9.4 04/26/2016   CALCIUM 9.4 11/13/2009   LFT  Recent Labs  04/26/16 1130 04/28/16 0103  PROT 7.7 8.2*  ALBUMIN 3.9 3.5  AST 171* 257*  ALT 344* 419*  ALKPHOS 374* 369*  BILITOT 7.8* 10.3*  BILIDIR  --  6.4*  IBILI  --  3.9*   PT/INR No results found for: INR, PROTIME Hepatitis Panel  Recent Labs  04/26/16 1130  HEPBSAG NEGATIVE  HCVAB NEGATIVE  HEPAIGM NON REACTIVE  HEPBIGM NON REACTIVE   C-Diff No components found for: CDIFF Lipase     Component Value Date/Time   LIPASE 56 (H) 04/28/2016 0103    Drugs of Abuse  No results found for: LABOPIA, COCAINSCRNUR, LABBENZ, AMPHETMU, THCU, LABBARB   RADIOLOGY STUDIES: Dg Chest 2 View  Result Date: 04/27/2016 CLINICAL DATA:  Epigastric pain which shortness-of-breath. Possible UTI on cephalexin. Worsening abdominal pain. EXAM: CHEST  2 VIEW COMPARISON:  None. FINDINGS: Right-sided ventriculoperitoneal shunt with irregularity and discontinuity of the tubing over the right flank. Lungs are somewhat hypoinflated with mild prominence of the perihilar markings. No focal airspace consolidation or effusion. Cardiomediastinal silhouette is within normal. Mild gastric distension. IMPRESSION: Minimal prominence of the perihilar markings which may be due to the degree of hypoinflation, although cannot exclude viral bronchopneumonia or minimal vascular congestion. Right-sided ventriculoperitoneal shunt with focal irregularity/ discontinuity of the shunt over the lateral thorax. Electronically Signed   By: Daniel  Boyle M.D.   On: 04/27/2016 21:55   Ct Abdomen  Pelvis W Contrast  Result Date: 04/28/2016 CLINICAL DATA:  25-year-old male with hyperbilirubinemia and diffuse abdominal pain. EXAM: CT ABDOMEN AND PELVIS WITH CONTRAST TECHNIQUE: Multidetector CT imaging of the abdomen and pelvis was performed using the standard protocol following bolus administration of intravenous contrast. CONTRAST:  100mL ISOVUE-300 IOPAMIDOL (ISOVUE-300) INJECTION 61% COMPARISON:  None. FINDINGS: Lower chest: Small right lung base subpleural density most likely represents atelectatic changes. Infiltrate is less likely but not entirely excluded. Clinical correlation is recommended. Left lung base atelectatic changes noted. No intra-abdominal free air or free fluid. Hepatobiliary: The gallbladder is distended. No calcified stone identified. There is noncalcified stone versus sludge within the gallbladder lumen. Noncalcified debris suspected within the midportion of the common bile duct as well as within the central CBD at the head of the pancreas adjacent to the ampulla. There is dilatation of the CBD measuring up to 16 mm. There is also dilatation of the intrahepatic biliary ducts. There is thickened appearance of the gallbladder wall. There is a 4.0 x 1.0 cm inflammatory collection adjacent to the gallbladder in the subhepatic area concerning for an abscess. The liver is unremarkable. Pancreas: Unremarkable. No pancreatic ductal dilatation or surrounding inflammatory changes. Spleen: Normal in size without focal abnormality. Adrenals/Urinary Tract: The adrenal glands appear unremarkable. The right kidney is atrophic. There is chronic mild dilatation of the right renal collecting system and right ureter. 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 There is mild left hydronephrosis. No obstructing stone identified. Correlation with urinalysis recommended to exclude acute UTI. The  urinary bladder is unremarkable. Stomach/Bowel: There is no evidence of bowel obstruction or active inflammation. Normal appendix. Vascular/Lymphatic: The abdominal aorta and IVC appear unremarkable. The SMV, splenic vein, and main portal vein are patent. No portal venous gas identified. There is no adenopathy. Reproductive:   The prostate and seminal vesicles are grossly unremarkable. Other: Right anterior chest wall VP shunt terminates in the subcutaneous soft tissues of the anterior abdominal wall. Right anterior pelvic wall scar. No fluid collection. Musculoskeletal: Chronic dislocation of the hips. Non fusion of the lumbar posterior elements compatible with spina bifida. There is protrusion and exposure of the CSF field thecal sac compatible with a meningocele. No acute fracture. Postsurgical changes of meningocele repair in the lower back. IMPRESSION: Dilatation of the intrahepatic and extrahepatic biliary tree as well as distended and inflamed gallbladder likely related to obstruction of the CBD with findings of cholecystitis and a small pericholecystic abscess. Associated acute cholangitis is not entirely excluded. Correlation with clinical exam and further evaluation with MRCP recommended to better evaluate for the gallbladder and CBD contents. Mild left hydronephrosis. Right renal atrophy with chronic appearing dilatation of the right renal collecting system and ureter. Correlation with urinalysis recommended to exclude acute UTI. 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 These results were called by telephone at the time of interpretation on 04/28/2016 at 4:12 am to physician assistant SHARI UPSTILL , who verbally acknowledged these results. Electronically Signed   By: Arash  Radparvar M.D.   On: 04/28/2016 04:18   Us Abdomen Limited  Result Date: 04/28/2016 CLINICAL DATA:  Initial evaluation for jaundice. Biliary dilatation seen on prior CT. EXAM: US ABDOMEN LIMITED - RIGHT UPPER  QUADRANT COMPARISON:  Prior CT from earlier the same day. FINDINGS: Gallbladder: Multiple echogenic stones present within the gallbladder lumen, largest of which measured approximately 1 cm. Gallbladder wall measure within normal limits at 2 mm. Irregular fluid density present within knee liver adjacent to the gallbladder fossa, better seen on recent CT. No sonographic Murphy sign elicited on exam. Common bile duct: Diameter: 12 mm. No definite hyperechoic stone seen within the dilated common bile duct. Liver: Irregular fluid density within the liver adjacent to the gallbladder fossa, better seen on prior CT. Hepatic parenchyma somewhat heterogeneous. IMPRESSION: 1. Cholelithiasis with dilatation of the common bile duct up to 12 mm. 2. Fluid opacity within the hepatic parenchyma adjacent to the gallbladder fossa, better seen on recent CT. 3. Heterogeneous echotexture of the liver. Electronically Signed   By: Benjamin  McClintock M.D.   On: 04/28/2016 05:35    IMPRESSION:   *  Cholangitis, cholecystitis, peri-cholecystic abscess.  ? Is biliary tree dilated due to GB infalmmation or is there choledocholithiasis?    *  UTI.  Rods in urine.  Neurogenic bladder.   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   PLAN:     *  In reviewing current meds, the current antibiotic regimen consists solely of ciprofloxacin as it looks like the Zosyn and vancomycin were given only once. Since inventory of metronidazole is critically low, will stop the Cipro and continue with Zosyn.  *  Decision as to ERCP per Dr Edythe Riches who will see pt later today.  IF ERCP planned, it would go tomorrow and Dr Hung will assume care and perform the ERCP.    *  Pt can have clears now, even advance diet if this is tolerated.   Sarah Gribbin  04/28/2016, 8:34 AM Pager: [phone]  GI ATTENDING  History, laboratories, x-rays reviewed. Patient personally seen and examined. Family in room.  The patient presents with ascending cholangitis secondary to choledocholithiasis. Has been hydrated and started on antibiotics. This afternoon he feels better. Vital signs stable. Recommend continuing   antibiotics. ERCP with sphincterotomy and stone extraction will be arranged with Dr. Patrick Hung will assume the patient's GI care tomorrow. He will need cholecystectomy after bile duct clearance  Maggy Wyble N. Shloimy Michalski, Jr., M.D. [facility] Gastroenterology     

## 2016-04-29 ENCOUNTER — Inpatient Hospital Stay (HOSPITAL_COMMUNITY): Payer: Medicare Other

## 2016-04-29 ENCOUNTER — Inpatient Hospital Stay (HOSPITAL_COMMUNITY): Payer: Medicare Other | Admitting: Anesthesiology

## 2016-04-29 ENCOUNTER — Encounter (HOSPITAL_COMMUNITY): Payer: Self-pay | Admitting: Anesthesiology

## 2016-04-29 ENCOUNTER — Encounter (HOSPITAL_COMMUNITY)
Admission: EM | Disposition: A | Discharge status: Home / Self Care | Payer: Self-pay | Source: Home / Self Care | Attending: Family Medicine

## 2016-04-29 DIAGNOSIS — R17 Unspecified jaundice: Secondary | ICD-10-CM

## 2016-04-29 HISTORY — PX: ENDOSCOPIC RETROGRADE CHOLANGIOPANCREATOGRAPHY (ERCP) WITH PROPOFOL: SHX5810

## 2016-04-29 LAB — COMPREHENSIVE METABOLIC PANEL
ALK PHOS: 236 U/L — AB (ref 38–126)
ALT: 242 U/L — AB (ref 17–63)
ALT: 233 U/L — ABNORMAL HIGH (ref 17–63)
ANION GAP: 16 — AB (ref 5–15)
AST: 104 U/L — AB (ref 15–41)
AST: 91 U/L — ABNORMAL HIGH (ref 15–41)
Albumin: 2.4 g/dL — ABNORMAL LOW (ref 3.5–5.0)
Albumin: 2.6 g/dL — ABNORMAL LOW (ref 3.5–5.0)
Alkaline Phosphatase: 244 U/L — ABNORMAL HIGH (ref 38–126)
Anion gap: 10 (ref 5–15)
BUN: 6 mg/dL (ref 6–20)
BUN: 7 mg/dL (ref 6–20)
CALCIUM: 8.2 mg/dL — AB (ref 8.9–10.3)
CHLORIDE: 107 mmol/L (ref 101–111)
CHLORIDE: 105 mmol/L (ref 101–111)
CO2: 21 mmol/L — AB (ref 22–32)
CO2: 17 mmol/L — AB (ref 22–32)
CREATININE: 0.82 mg/dL (ref 0.61–1.24)
Calcium: 8.4 mg/dL — ABNORMAL LOW (ref 8.9–10.3)
Creatinine, Ser: 0.96 mg/dL (ref 0.61–1.24)
GFR calc Af Amer: >60 mL/min (ref >60)
Glucose, Bld: 62 mg/dL — ABNORMAL LOW (ref 65–99)
Glucose, Bld: 52 mg/dL — ABNORMAL LOW (ref 65–99)
POTASSIUM: 3.7 mmol/L (ref 3.5–5.1)
Potassium: 3.6 mmol/L (ref 3.5–5.1)
SODIUM: 138 mmol/L (ref 135–145)
SODIUM: 138 mmol/L (ref 135–145)
Total Bilirubin: 7.1 mg/dL — ABNORMAL HIGH (ref 0.3–1.2)
Total Bilirubin: 6.7 mg/dL — ABNORMAL HIGH (ref 0.3–1.2)
Total Protein: 5.9 g/dL — ABNORMAL LOW (ref 6.5–8.1)
Total Protein: 6.1 g/dL — ABNORMAL LOW (ref 6.5–8.1)

## 2016-04-29 LAB — CBC
HCT: 37.3 % — ABNORMAL LOW (ref 39.0–52.0)
Hemoglobin: 12.5 g/dL — ABNORMAL LOW (ref 13.0–17.0)
MCH: 29.8 pg (ref 26.0–34.0)
MCHC: 33.5 g/dL (ref 30.0–36.0)
MCV: 88.8 fL (ref 78.0–100.0)
PLATELETS: 289 10*3/uL (ref 150–400)
RBC: 4.2 MIL/uL — ABNORMAL LOW (ref 4.22–5.81)
RDW: 15.8 % — AB (ref 11.5–15.5)
WBC: 8.6 10*3/uL (ref 4.0–10.5)

## 2016-04-29 SURGERY — ENDOSCOPIC RETROGRADE CHOLANGIOPANCREATOGRAPHY (ERCP) WITH PROPOFOL
Anesthesia: General

## 2016-04-29 MED ORDER — SUGAMMADEX SODIUM 500 MG/5ML IV SOLN
INTRAVENOUS | Status: DC | PRN
  Administered 2016-04-29: 200 mg via INTRAVENOUS

## 2016-04-29 MED ORDER — IOPAMIDOL (ISOVUE-300) INJECTION 61%
INTRAVENOUS | Status: AC
  Filled 2016-04-29: qty 50

## 2016-04-29 MED ORDER — INDOMETHACIN 50 MG RE SUPP
RECTAL | Status: AC
  Filled 2016-04-29: qty 2

## 2016-04-29 MED ORDER — FENTANYL CITRATE (PF) 100 MCG/2ML IJ SOLN
INTRAMUSCULAR | Status: AC
  Filled 2016-04-29: qty 4

## 2016-04-29 MED ORDER — ROCURONIUM BROMIDE 100 MG/10ML IV SOLN
INTRAVENOUS | Status: DC | PRN
  Administered 2016-04-29: 40 mg via INTRAVENOUS

## 2016-04-29 MED ORDER — GLUCAGON HCL RDNA (DIAGNOSTIC) 1 MG IJ SOLR
INTRAMUSCULAR | Status: AC
  Filled 2016-04-29: qty 1

## 2016-04-29 MED ORDER — PHENYLEPHRINE HCL 10 MG/ML IJ SOLN
INTRAMUSCULAR | Status: DC | PRN
  Administered 2016-04-29 (×3): 80 ug via INTRAVENOUS

## 2016-04-29 MED ORDER — ONDANSETRON HCL 4 MG/2ML IJ SOLN
INTRAMUSCULAR | Status: DC | PRN
  Administered 2016-04-29: 4 mg via INTRAVENOUS

## 2016-04-29 MED ORDER — FENTANYL CITRATE (PF) 100 MCG/2ML IJ SOLN
INTRAMUSCULAR | Status: DC | PRN
  Administered 2016-04-29 (×2): 50 ug via INTRAVENOUS

## 2016-04-29 MED ORDER — DEXAMETHASONE SODIUM PHOSPHATE 10 MG/ML IJ SOLN
INTRAMUSCULAR | Status: DC | PRN
  Administered 2016-04-29: 10 mg via INTRAVENOUS

## 2016-04-29 MED ORDER — INDOMETHACIN 50 MG RE SUPP
RECTAL | Status: DC | PRN
  Administered 2016-04-29 (×2): 50 mg via RECTAL

## 2016-04-29 MED ORDER — SODIUM CHLORIDE 0.9 % IV SOLN: INTRAVENOUS | Status: DC

## 2016-04-29 MED ORDER — PROPOFOL 10 MG/ML IV BOLUS
INTRAVENOUS | Status: DC | PRN
Start: 2016-04-29 — End: 2016-04-29
  Administered 2016-04-29: 150 mg via INTRAVENOUS
  Administered 2016-04-29: 50 mg via INTRAVENOUS

## 2016-04-29 MED ORDER — LIDOCAINE HCL (CARDIAC) 20 MG/ML IV SOLN
INTRAVENOUS | Status: DC | PRN
  Administered 2016-04-29: 60 mg via INTRATRACHEAL

## 2016-04-29 MED ORDER — IOPAMIDOL (ISOVUE-300) INJECTION 61%
INTRAVENOUS | Status: DC | PRN
  Administered 2016-04-29: 25 mL

## 2016-04-29 NOTE — Progress Notes (Signed)
Family Medicine Teaching Service Daily Progress Note Intern Pager: [phone]  Patient name: Hector Shea Medical record number: [id number] Date of birth: 1991/03/11 Age: 26 y.o. Gender: male  Primary Care Provider: Uvaldo RisingFLETKE, KYLE, J, MD Consultants: Gastroenterology, general surgery Code Status: Full  Pt Overview and Major Events to Date:  01/28: Admit for RUQ pain concerning for acute cholangitis 01/29: ERCP for ascending cholangitis  Assessment and Plan: Hector Shea is a 26 y.o. male presenting with worsening abdominal pain. PMH is significant for spina bifida with hydrocephalus, ventricular shunt, and neurogenic bladder with incontinence.  #Ascending Cholangitis: Acute, improved. Upper quadrant pain. Liver enzymes and bilirubin improving since admission. Leukocytosis resolved. CT with evidence of dilation of the biliary tree. Lipase only mildly elevated on admission. Vitals remained stable and afebrile. GI to perform ERCP today.  --Gastroenterology consulted, appreciate recommendations --ERCP on 1/29 --Percutaneous decompression may be necessary --IV Zosyn per GI recommendations (day 2, 1/28>) --CMET daily --Blood cultures pending 2 --NPO --Fentanyl as needed for pain (avoid morphine due to effects on Sphincter of Oddi)  #Tachycardia: Acute, Resolved. Presented 120s to 150s. Likely secondary to pain. Possible indication of worsening status. --Control pain; fentanyl every 2 hours as needed --IV NS at 150 mL/hour  #Neurogenic bladder with incontinence: 2/2 spina bifida. In and out cath at home --Foley due to anticipation for possible procedures  FEN/GI: NS IV @150ml /hr; NPO Prophylaxis: Heparin SQ  Disposition: Pending ERCP for ascending cholangitis.  Subjective:  [employer] interpreter used: Hector Shea [phone]#[id number]. Patient accompanied by family in room stating overall he feels better than yesterday. Continues to have right upper quadrant pain but improved.  Denies nausea or vomiting. Has not passed a bowel movement. Patient states he was told by GI doctor she would undergo a procedure but was not told what the procedure entails.  Objective: Temp:  [98.1 F (36.7 C)-99.2 F (37.3 C)] 99.2 F (37.3 C) (01/29 0427) Pulse Rate:  [95-131] 95 (01/29 0427) Resp:  [13-22] 17 (01/29 0427) BP: (115-128)/(68-87) 115/68 (01/29 0427) SpO2:  [98 %-99 %] 98 % (01/29 0427) Physical Exam: General: well nourished, well developed, in no acute distress with non-toxic appearance HEENT: normocephalic, atraumatic, moist mucous membranes CV: regular rate and rhythm without murmurs, rubs, or gallops Lungs: clear to auscultation bilaterally with normal work of breathing Abdomen: soft, mildly tender at RUQ without rebound or guarding, no masses or organomegaly palpable, normoactive bowel sounds Skin: warm, dry, no rashes or lesions, cap refill < 2 seconds Extremities: warm and well perfused, normal tone  Laboratory:  Recent Labs Lab 04/26/16 1130 04/27/16 2120 04/29/16 0250  WBC 6.5 14.2* 8.6  HGB 15.7 15.4 12.5*  HCT 47.4 45.8 37.3*  PLT 181 297 289    Recent Labs Lab 04/26/16 1130 04/27/16 2120 04/28/16 0103 04/29/16 0250  NA 138 133*  --  138  K 4.2 3.7  --  3.6  CL 98 95*  --  107  CO2 21 23  --  21*  BUN 13 11  --  6  CREATININE 0.76 1.06  --  0.82  CALCIUM 9.4 9.3  --  8.2*  PROT 7.7  --  8.2* 5.9*  BILITOT 7.8*  --  10.3* 7.1*  ALKPHOS 374*  --  369* 236*  ALT 344*  --  419* 242*  AST 171*  --  257* 104*  GLUCOSE 72 140*  --  62*   Urinalysis    Component Value Date/Time   COLORURINE AMBER (A) 04/28/2016  0345   APPEARANCEUR HAZY (A) 04/28/2016 0345   LABSPEC 1.016 04/28/2016 0345   PHURINE 6.0 04/28/2016 0345   GLUCOSEU NEGATIVE 04/28/2016 0345   HGBUR MODERATE (A) 04/28/2016 0345   BILIRUBINUR NEGATIVE 04/28/2016 0345   BILIRUBINUR LARGE 04/26/2016 1210   KETONESUR 5 (A) 04/28/2016 0345   PROTEINUR NEGATIVE 04/28/2016 0345    UROBILINOGEN 1.0 04/26/2016 1210   NITRITE POSITIVE (A) 04/28/2016 0345   LEUKOCYTESUR LARGE (A) 04/28/2016 0345   Urine culture: >100,000 GNB Blood culture: Pending x2 Hepatic panel: Negative Lipase: 56  Imaging/Diagnostic Tests: US Abdomen Limited (04/28/2016) FINDINGS: Gallbladder: Multiple echogenic stones present within the gallbladder lumen, largest of which measured approximately 1 cm. Gallbladder wall measure within normal limits at 2 mm. Irregular fluid density present within knee liver adjacent to the gallbladder fossa, better seen on recent CT. No sonographic Murphy sign elicited on exam.  Common bile duct: Diameter: 12 mm. No definite hyperechoic stone seen within the dilated common bile duct.  Liver: Irregular fluid density within the liver adjacent to the gallbladder fossa, better seen on prior CT. Hepatic parenchyma somewhat heterogeneous.  IMPRESSION: 1. Cholelithiasis with dilatation of the common bile duct up to 12 mm. 2. Fluid opacity within the hepatic parenchyma adjacent to the gallbladder fossa, better seen on recent CT. 3. Heterogeneous echotexture of the liver.  CT Abdomen Pelvis W Contrast (04/28/2016) FINDINGS: Lower chest: Small right lung base subpleural density most likely represents atelectatic changes. Infiltrate is less likely but not entirely excluded. Clinical correlation is recommended. Left lung base atelectatic changes noted.  No intra-abdominal free air or free fluid.  Hepatobiliary: The gallbladder is distended. No calcified stone identified. There is noncalcified stone versus sludge within the gallbladder lumen. Noncalcified debris suspected within the midportion of the common bile duct as well as within the central CBD at the head of the pancreas adjacent to the ampulla. There is dilatation of the CBD measuring up to 16 mm. There is also dilatation of the intrahepatic biliary ducts. There is thickened appearance of the gallbladder wall.  There is a 4.0 x 1.0 cm inflammatory collection adjacent to the gallbladder in the subhepatic area concerning for an abscess. The liver is unremarkable.  Pancreas: Unremarkable. No pancreatic ductal dilatation or surrounding inflammatory changes.  Spleen: Normal in size without focal abnormality.  Adrenals/Urinary Tract: The adrenal glands appear unremarkable. The right kidney is atrophic. There is chronic mild dilatation of the right renal collecting system and right ureter. 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 There is mild left hydronephrosis. No obstructing stone identified. Correlation with urinalysis recommended to exclude acute UTI. The urinary bladder is unremarkable.  Stomach/Bowel: There is no evidence of bowel obstruction or active inflammation. Normal appendix.  Vascular/Lymphatic: The abdominal aorta and IVC appear unremarkable. The SMV, splenic vein, and main portal vein are patent. No portal venous gas identified. There is no adenopathy.  Reproductive: The prostate and seminal vesicles are grossly unremarkable.  Other: Right anterior chest wall VP shunt terminates in the subcutaneous soft tissues of the anterior abdominal wall. Right anterior pelvic wall scar. No fluid collection.  Musculoskeletal: Chronic dislocation of the hips. Non fusion of the lumbar posterior elements compatible with spina bifida. There is protrusion and exposure of the CSF field thecal sac compatible with a meningocele. No acute fracture. Postsurgical changes of meningocele repair in the lower back.  IMPRESSION: Dilatation of the intrahepatic  and extrahepatic biliary tree as well as distended and inflamed gallbladder likely related to obstruction of the CBD with findings of cholecystitis and a small pericholecystic abscess. Associated acute cholangitis is not entirely excluded.  Correlation with clinical exam and further evaluation with MRCP recommended to better evaluate for the gallbladder and CBD contents.  Mild left hydronephrosis. Right renal atrophy with chronic appearing dilatation of the right renal collecting system and ureter. Correlation with urinalysis recommended to exclude acute UTI.  Spina bifida and meningocele.  Chronic bilateral dislocated hips.  DG Chest 2 View (04/27/2016) FINDINGS: Right-sided ventriculoperitoneal shunt with irregularity and discontinuity of the tubing over the right flank. Lungs are somewhat hypoinflated with mild prominence of the perihilar markings. No focal airspace consolidation or effusion. Cardiomediastinal silhouette is within normal. Mild gastric distension.  IMPRESSION: Minimal prominence of the perihilar markings which may be due to the degree of hypoinflation, although cannot exclude viral bronchopneumonia or minimal vascular congestion.  Right-sided ventriculoperitoneal shunt with focal irregularity/ discontinuity of the shunt over the lateral thorax.    Wendee Beavers, DO 04/29/2016, 7:02 AM PGY-1, [facility] FPTS Intern pager: [phone], text pages welcome

## 2016-04-29 NOTE — Progress Notes (Signed)
[facility] Progress Note     Subjective: Hector Shea denies abdominal pain. No acute events overnight. No nausea or vomiting.  Objective: Vital signs in last 24 hours: Temp:  [98.1 F (36.7 C)-99.2 F (37.3 C)] 98.9 F (37.2 C) (01/29 0735) Pulse Rate:  [95-131] 96 (01/29 0735) Resp:  [13-22] 19 (01/29 0735) BP: (115-134)/(68-87) 134/78 (01/29 0735) SpO2:  [97 %-99 %] 97 % (01/29 0735) Last BM Date:  (PTA)  Intake/Output from previous day: 01/28 0701 - 01/29 0700 In: 3517.5 [I.V.:3367.5; IV Piggyback:150] Out: 1650 [Urine:1650] Intake/Output this shift: No intake/output data recorded.  PE: Gen:  Alert, NAD, pleasant, cooperative, lying in bed Card:  Tachycardic, regular rhythm, S1 and S2 normal Pulm:  Rate and effort normal Abd: Soft, nondistended, nontender, +BS Skin: no rashes noted, warm and dry  Lab Results:   Recent Labs  04/27/16 2120 04/29/16 0250  WBC 14.2* 8.6  HGB 15.4 12.5*  HCT 45.8 37.3*  PLT 297 289   BMET  Recent Labs  04/27/16 2120 04/29/16 0250  NA 133* 138  K 3.7 3.6  CL 95* 107  CO2 23 21*  GLUCOSE 140* 62*  BUN 11 6  CREATININE 1.06 0.82  CALCIUM 9.3 8.2*   Hector Shea/INR No results for input(s): LABPROT, INR in the last 72 hours. CMP     Component Value Date/Time   NA 138 04/29/2016 0250   K 3.6 04/29/2016 0250   CL 107 04/29/2016 0250   CO2 21 (L) 04/29/2016 0250   GLUCOSE 62 (L) 04/29/2016 0250   BUN 6 04/29/2016 0250   CREATININE 0.82 04/29/2016 0250   CREATININE 0.76 04/26/2016 1130   CALCIUM 8.2 (L) 04/29/2016 0250   PROT 5.9 (L) 04/29/2016 0250   ALBUMIN 2.4 (L) 04/29/2016 0250   AST 104 (H) 04/29/2016 0250   ALT 242 (H) 04/29/2016 0250   ALKPHOS 236 (H) 04/29/2016 0250   BILITOT 7.1 (H) 04/29/2016 0250   GFRNONAA >60 04/29/2016 0250   GFRNONAA >89 04/26/2016 1130   GFRAA >60 04/29/2016 0250   GFRAA >89 04/26/2016 1130   Lipase     Component Value Date/Time   LIPASE 56 (H) 04/28/2016 0103        Studies/Results: Dg Chest 2 View  Result Date: 04/27/2016 CLINICAL DATA:  Epigastric pain which shortness-of-breath. Possible UTI on cephalexin. Worsening abdominal pain. EXAM: CHEST  2 VIEW COMPARISON:  None. FINDINGS: Right-sided ventriculoperitoneal shunt with irregularity and discontinuity of the tubing over the right flank. Lungs are somewhat hypoinflated with mild prominence of the perihilar markings. No focal airspace consolidation or effusion. Cardiomediastinal silhouette is within normal. Mild gastric distension. IMPRESSION: Minimal prominence of the perihilar markings which may be due to the degree of hypoinflation, although cannot exclude viral bronchopneumonia or minimal vascular congestion. Right-sided ventriculoperitoneal shunt with focal irregularity/ discontinuity of the shunt over the lateral thorax. Electronically Signed   By: Elberta Fortis M.D.   On: 04/27/2016 21:55   Ct Abdomen Pelvis W Contrast  Result Date: 04/28/2016 CLINICAL DATA:  26 year old male with hyperbilirubinemia and diffuse abdominal pain. EXAM: CT ABDOMEN AND PELVIS WITH CONTRAST TECHNIQUE: Multidetector CT imaging of the abdomen and pelvis was performed using the standard protocol following bolus administration of intravenous contrast. CONTRAST:  ISOVUE-300 IOPAMIDOL (ISOVUE-300) INJECTION 61% COMPARISON:  None. FINDINGS: Lower chest: Small right lung base subpleural density most likely represents atelectatic changes. Infiltrate is less likely but not entirely excluded. Clinical correlation is recommended. Left lung base atelectatic changes noted. No intra-abdominal free  air or free fluid. Hepatobiliary: The gallbladder is distended. No calcified stone identified. There is noncalcified stone versus sludge within the gallbladder lumen. Noncalcified debris suspected within the midportion of the common bile duct as well as within the central CBD at the head of the pancreas adjacent to the ampulla. There is  dilatation of the CBD measuring up to 16 mm. There is also dilatation of the intrahepatic biliary ducts. There is thickened appearance of the gallbladder wall. There is a 4.0 x 1.0 cm inflammatory collection adjacent to the gallbladder in the subhepatic area concerning for an abscess. The liver is unremarkable. Pancreas: Unremarkable. No pancreatic ductal dilatation or surrounding inflammatory changes. Spleen: Normal in size without focal abnormality. Adrenals/Urinary Tract: The adrenal glands appear unremarkable. The right kidney is atrophic. There is chronic mild dilatation of the right renal collecting system and right ureter. 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 There is mild left hydronephrosis. No obstructing stone identified. Correlation with urinalysis recommended to exclude acute UTI. The urinary bladder is unremarkable. Stomach/Bowel: There is no evidence of bowel obstruction or active inflammation. Normal appendix. Vascular/Lymphatic: The abdominal aorta and IVC appear unremarkable. The SMV, splenic vein, and main portal vein are patent. No portal venous gas identified. There is no adenopathy. Reproductive: The prostate and seminal vesicles are grossly unremarkable. Other: Right anterior chest wall VP shunt terminates in the subcutaneous soft tissues of the anterior abdominal wall. Right anterior pelvic wall scar. No fluid collection. Musculoskeletal: Chronic dislocation of the hips. Non fusion of the lumbar posterior elements compatible with spina bifida. There is protrusion and exposure of the CSF field thecal sac compatible with a meningocele. No acute fracture. Postsurgical changes of meningocele repair in the lower back. IMPRESSION: Dilatation of the intrahepatic and extrahepatic biliary tree as well as distended and inflamed gallbladder likely related to obstruction of the CBD with  findings of cholecystitis and a small pericholecystic abscess. Associated acute cholangitis is not entirely excluded. Correlation with clinical exam and further evaluation with MRCP recommended to better evaluate for the gallbladder and CBD contents. Mild left hydronephrosis. Right renal atrophy with chronic appearing dilatation of the right renal collecting system and ureter. Correlation with urinalysis recommended to exclude acute UTI. 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 These results were called by telephone at the time of interpretation on 04/28/2016 at 4:12 am to physician assistant [facility] UPSTILL , who verbally acknowledged these results. Electronically Signed   By: Elgie CollardArash  Radparvar M.D.   On: 04/28/2016 04:18   [place] Abdomen Limited  Result Date: 04/28/2016 CLINICAL DATA:  Initial evaluation for jaundice. Biliary dilatation seen on prior CT. EXAM: US ABDOMEN LIMITED - RIGHT UPPER QUADRANT COMPARISON:  Prior CT from earlier the same day. FINDINGS: Gallbladder: Multiple echogenic stones present within the gallbladder lumen, largest of which measured approximately 1 cm. Gallbladder wall measure within normal limits at 2 mm. Irregular fluid density present within knee liver adjacent to the gallbladder fossa, better seen on recent CT. No sonographic Murphy sign elicited on exam. Common bile duct: Diameter: 12 mm. No definite hyperechoic stone seen within the dilated common bile duct. Liver: Irregular fluid density within the liver adjacent to the gallbladder fossa, better seen on prior CT. Hepatic parenchyma somewhat heterogeneous. IMPRESSION: 1. Cholelithiasis with dilatation of the common bile duct up to 12 mm. 2. Fluid opacity within the hepatic parenchyma adjacent to the gallbladder fossa,  better seen on recent CT. 3. Heterogeneous echotexture of the liver. Electronically Signed   By: Rise Mu M.D.   On: 04/28/2016 05:35    Anti-infectives: Anti-infectives    Start      Dose/Rate Route Frequency Ordered Stop   04/28/16 1400  piperacillin-tazobactam (ZOSYN) IVPB 3.375 g  Status:  Discontinued     3.375 g 100 mL/hr over 30 Minutes Intravenous Every 8 hours 04/28/16 1006 04/28/16 1007   04/28/16 1030  piperacillin-tazobactam (ZOSYN) IVPB 3.375 g     3.375 g 12.5 mL/hr over 240 Minutes Intravenous Every 8 hours 04/28/16 1008     04/28/16 0730  ciprofloxacin (CIPRO) IVPB 400 mg  Status:  Discontinued     400 mg 200 mL/hr over 60 Minutes Intravenous Every 12 hours 04/28/16 0657 04/28/16 1006   04/28/16 0215  vancomycin (VANCOCIN) IVPB 1000 mg/200 mL premix     1,000 mg 200 mL/hr over 60 Minutes Intravenous  Once 04/28/16 0211 04/28/16 0451   04/28/16 0215  piperacillin-tazobactam (ZOSYN) IVPB 3.375 g     3.375 g 100 mL/hr over 30 Minutes Intravenous  Once 04/28/16 0211 04/28/16 0317       Assessment/Plan  Spina bifida with hydrocephalus 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 Tachycardia  Ascending cholangitis, cholecystitis with small pericholecystic abscess - LFT's and bilirubin improving since admission - abdominal pain improved - GI to perform ERCP today - discussed possible cholecystectomy this admission - NPO, IVF, abx  We will continue to follow this Hector Shea.    LOS: 1 day    Jerre Simon , [facility] 04/29/2016, 9:18 AM Pager: [phone] Consults: [phone] Mon-Fri 7:00 am-4:30 pm Sat-Sun 7:00 am-11:30 am

## 2016-04-29 NOTE — Progress Notes (Signed)
Interpreter Wyvonnia DuskyGraciela Namihira for [employer] CorporationMaggie RN

## 2016-04-29 NOTE — Discharge Summary (Signed)
Family [facility]  Patient name: Hector Shea Medical record number: [id number] Date of birth: 16-Feb-1991 Age: 26 y.o. Gender: male Date of Admission: 04/28/2016  Date of Discharge: 05/06/2016 Admitting Physician: Doreene ElandKehinde T Eniola, MD  Primary Care Provider: Uvaldo RisingFLETKE, KYLE, J, MD Consultants: General surgery, gastroenterology  Indication for Hospitalization: Ascending cholangitis  Discharge Diagnoses/Problem List:  Ascending cholangitis s/p lap-chole Tachycardia and dyspnea without hypoxia Yeast UCx  Disposition: Home  Discharge Condition: Stable, improved  Discharge Exam:  General: 25yo male resting comfortably in no apparent distress CV: S1 and S2 noted without murmur, regular rate and rhythm Lungs: clear to auscultation bilaterally with normal work of breathing Abdomen: soft, minimally tender near sites of incision without rebound or guarding, normoactive bowel sounds, dressings dry without surrounding erythema or edema Skin: warm, dry, no rashes or lesions, cap refill < 2 seconds Extremities: warm and well perfused, normal tone   Brief Hospital Course:  Hector NickelsJuan Manuel Varela Juarezis a 25 y.o.malepresenting with worsening abdominal pain. PMH is significant for spina bifida with hydrocephalus, ventricular shunt,and neurogenic bladder with incontinence.  Patient presented with severe abdominal pain of 2 day duration. Pain located along RUQ with voluntary guarding concerning for acute abdomen. Patient endorsed decreased oral intake but denied nausea or vomiting. No history of diarrhea or constipation. Patient cannot recall if he was experiencing fevers at home. He was seen at PCP office earlier in the week and [facility] elevated [facility]. Pain worsened [facility]  Upon arrival to ED, patient assented tachycardic with intermittent fevers. AST/ALT elevated at 257/419 respectively with  hyperbilirubinemia and a leukocytosis of 14.2. Lipase mildly elevated. CT abdomen and pelvis consistent with dilated biliary tree concerning for ascending cholangitis. The cultures obtained which remain negative and started IV Zosyn. GI consultation and performed ERCP on 1/29 with removal of many stones without complications. Patient subsequently seen by general surgery who performed laparoscopic cholecystectomy on 1/30 complicated by spillage of biliary contents with irrigation. Subsequently experienced persistent tachycardia with intermittent fevers. Given multiple risk factors, patient underwent CTA negative for PE. Patient continued Zosyn and vancomycin added given persistent fevers. UA consistent with yeast, patient started on diflucan. Tachycardia and fevers resolved. Blood cultures without growth. LFTs continued to improve during hospitalization. Surgery removed surgical drain prior to discharge. Patient continued 5 more days of doxy for MRSA coverage given.  Issues for Follow Up:  1. Patient to complete 5 more days of doxy concerning SOB during hospitalization s/p surgery with fevers despite IV zosyn coverage. Concern for MRSA though patient clinically improved with complete resolution of respiratory status by d/c. Patient will have completed 14 days of abx. 2. Surgical drain removed by general surgery by discharge. Patient to follow up with their office after discharge. 3. Patient noted to have fungal UCx though w/o si/sx of UTI or systemic source. Diflucan discontinued on d/c. 4. Patient able to advance to full diet with passage of stool. Requested miralax on d/c due to straining and rectal pain. Please follow up. 5. Of note, there was concerns for patients ventriculoperitoneal shunt with focal irregularity/ discontinuity of the shunt over the lateral thorax. This was discussed with neurosurgery who explained normal varient as patient age, shunt breaks. Only concerning if patient has neurological  changes which never presented.  Significant Procedures: ERCP (1/29); laparoscopic cholecystectomy (1/30)  Significant Labs and Imaging:   Recent Labs Lab 05/03/16 0249 05/04/16 1141 05/06/16 0459  WBC  11.1* 11.9* 9.9  HGB 11.0* 11.4* 12.0*  HCT 35.2* 35.5* 37.4*  PLT 391 510* 633*    Recent Labs Lab 05/01/16 0337 05/02/16 0330 05/03/16 0249 05/04/16 1141 05/06/16 0459  NA 136 140 142 141 139  K 3.2* 2.9* 3.8 3.6 3.6  CL 106 111 111 105 103  CO2 20* 20* 23 26 26   GLUCOSE 95 105* 116* 130* 108*  BUN 11 5* 6 6 9   CREATININE 0.89 0.74 0.83 0.80 1.01  CALCIUM 7.7* 7.4* 7.5* 8.2* 8.6*  MG  --  1.4*  --   --   --   ALKPHOS 165* 130* 106 102 96  AST 53* 34 21 18 20   ALT 131* 91* 66* 48 36  ALBUMIN 2.2* 2.1* 1.9* 2.0* 2.2*   Urinalysis    Component Value Date/Time   COLORURINE AMBER (A) 04/28/2016 0345   APPEARANCEUR HAZY (A) 04/28/2016 0345   LABSPEC 1.016 04/28/2016 0345   PHURINE 6.0 04/28/2016 0345   GLUCOSEU NEGATIVE 04/28/2016 0345   HGBUR MODERATE (A) 04/28/2016 0345   BILIRUBINUR NEGATIVE 04/28/2016 0345   BILIRUBINUR LARGE 04/26/2016 1210   KETONESUR 5 (A) 04/28/2016 0345   PROTEINUR NEGATIVE 04/28/2016 0345   UROBILINOGEN 1.0 04/26/2016 1210   NITRITE POSITIVE (A) 04/28/2016 0345   LEUKOCYTESUR LARGE (A) 04/28/2016 0345   Urine culture: >100,000 GNB Repeat Urine culture: 80,000 yeast Blood culture (1/28): NGTD Repeat Blood culture (1/31): NGTD Hepatic panel: Negative Lipase: 56  Imaging/Diagnostic Tests: ERCP (04/30/2016) - Choledocholithiasis was found. Complete removal was accomplished by balloon extraction. - The biliary tree was swept.  US Abdomen Limited (04/28/2016) FINDINGS: Gallbladder: Multiple echogenic stones present within the gallbladder lumen, largest of which measured approximately 1 cm. Gallbladder wall measure within normal limits at 2 mm. Irregular fluid density present within knee liver adjacent to the gallbladder fossa,  better seen on recent CT. No sonographic Murphy sign elicited on exam.  Common bile duct: Diameter: 12 mm. No definite hyperechoic stone seen within the dilated common bile duct.  Liver: Irregular fluid density within the liver adjacent to the gallbladder fossa, better seen on prior CT. Hepatic parenchyma somewhat heterogeneous.  IMPRESSION: 1. Cholelithiasis with dilatation of the common bile duct up to 12 mm. 2. Fluid opacity within the hepatic parenchyma adjacent to the gallbladder fossa, better seen on recent CT. 3. Heterogeneous echotexture of the liver.  CT Abdomen Pelvis W Contrast (04/28/2016) FINDINGS: Lower chest: Small right lung base subpleural density most likely represents atelectatic changes. Infiltrate is less likely but not entirely excluded. Clinical correlation is recommended. Left lung base atelectatic changes noted.  No intra-abdominal free air or free fluid.  Hepatobiliary: The gallbladder is distended. No calcified stone identified. There is noncalcified stone versus sludge within the gallbladder lumen. Noncalcified debris suspected within the midportion of the common bile duct as well as within the central CBD at the head of the pancreas adjacent to the ampulla. There is dilatation of the CBD measuring up to 16 mm. There is also dilatation of the intrahepatic biliary ducts. There is thickened appearance of the gallbladder wall. There is a 4.0 x 1.0 cm inflammatory collection adjacent to the gallbladder in the subhepatic area concerning for an abscess. The liver is unremarkable.  Pancreas: Unremarkable. No pancreatic ductal dilatation or surrounding inflammatory changes.  Spleen: Normal in size without focal abnormality.  Adrenals/Urinary Tract: The adrenal glands appear unremarkable. The right kidney is atrophic. There is chronic mild dilatation of the right renal collecting system and right  ureter. 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 There is mild left hydronephrosis. No obstructing stone identified. Correlation with urinalysis recommended to exclude acute UTI. The urinary bladder is unremarkable.  Stomach/Bowel: There is no evidence of bowel obstruction or active inflammation. Normal appendix.  Vascular/Lymphatic: The abdominal aorta and IVC appear unremarkable. The SMV, splenic vein, and main portal vein are patent. No portal venous gas identified. There is no adenopathy.  Reproductive: The prostate and seminal vesicles are grossly unremarkable.  Other: Right anterior chest wall VP shunt terminates in the subcutaneous soft tissues of the anterior abdominal wall. Right anterior pelvic wall scar. No fluid collection.  Musculoskeletal: Chronic dislocation of the hips. Non fusion of the lumbar posterior elements compatible with spina bifida. There is protrusion and exposure of the CSF field thecal sac compatible with a meningocele. No acute fracture. Postsurgical changes of meningocele repair in the lower back.  IMPRESSION: Dilatation of the intrahepatic and extrahepatic biliary tree as well as distended and inflamed gallbladder likely related to obstruction of the CBD with findings of cholecystitis and a small pericholecystic abscess. Associated acute cholangitis is not entirely excluded. Correlation with clinical exam and further evaluation with MRCP recommended to better evaluate for the gallbladder and CBD contents.  Mild left hydronephrosis. Right renal atrophy with chronic appearing dilatation of the right renal collecting system and ureter. Correlation with urinalysis recommended to exclude acute UTI.  Spina bifida and meningocele.  Chronic bilateral dislocated hips.  DG Chest 2 View (04/27/2016) FINDINGS: Right-sided ventriculoperitoneal shunt with irregularity and discontinuity of the tubing over the right  flank. Lungs are somewhat hypoinflated with mild prominence of the perihilar markings. No focal airspace consolidation or effusion. Cardiomediastinal silhouette is within normal. Mild gastric distension.  IMPRESSION: Minimal prominence of the perihilar markings which may be due to the degree of hypoinflation, although cannot exclude viral bronchopneumonia or minimal vascular congestion.  Right-sided ventriculoperitoneal shunt with focal irregularity/ discontinuity of the shunt over the lateral thorax.  Results/Tests Pending at Time of Discharge: None  Discharge Medications:  Allergies as of 05/06/2016      Reactions   Advil [ibuprofen]       Medication List    TAKE these medications   doxycycline 100 MG capsule Commonly known as:  VIBRAMYCIN Take 1 capsule (100 mg total) by mouth 2 (two) times daily.   oxybutynin 5 MG tablet Commonly known as:  DITROPAN Take 2 tablets in the morning, 1 tablet at lunch, and 2 tablets at night for bladder spasm. What changed:  how much to take  how to take this  when to take this  additional instructions   polyethylene glycol packet Commonly known as:  MIRALAX / GLYCOLAX Take 17 g by mouth daily. Start taking on:  05/07/2016       Discharge Instructions: Please refer to Patient Instructions section of EMR for full details.  Patient was counseled important signs and symptoms that should prompt return to medical care, changes in medications, dietary instructions, activity restrictions, and follow up appointments.   Follow-Up Appointments: Follow-up Information    [facility] Follow up on 05/21/2016.   Specialty:  General Surgery Why:  your appointment is at 10:30 AM, be at the office 30 minutes early for check-in.  Bring insurance and Photo ID. Contact information: [address] [place] [address] [phone]  Almon Hercules, MD. Go on 05/09/2016.   Specialty:  Family Medicine Why:  Go to appointment  at 2:30 PM. Contact information: [address] [place] [address] [phone]           Wendee Beavers, DO 05/06/2016, 6:22 PM PGY-1, [facility]

## 2016-04-29 NOTE — H&P (View-Only) (Signed)
[facility] Consult: 8:34 AM 04/28/2016  LOS: 0 days    Referring Provider: Dr Lum Shea, teaching service  Primary Care Physician:  Hector RisingFLETKE, KYLE, J, Hector Shea Primary Gastroenterologist:  unassigned   Seen in consult for Hector Jeani HawkingPatrick Shea Reason for Consultation:  Cholangitis   HPI: Hector Shea is a 26 y.o. male.  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  Wheelchair bound.  Neurogenic bladder requires self catheterization.  Hx sacral decubitus surgery.  Seborrheic dermatitis.   Admitted this AM with 2 days abd pain, fevers, chills.  No N/V.  +oliguria.  Jaundiced.  Meets SIRS criteria.  CT ab/pelvis: Intra/extra hepatic bile ducts dilated.  Acute cholecystitis , small pericholecystic abscess.  Left hydronephrosis.  Chronic dilation right renal collecting system.  Spina bifida and meningocele.  Chronic bilateral dislocated hips. Abd ultrasound: Cholelithiasis, 12 mm CBD.  Fluid in hepatic parenchyma.  Heterogenous liver.     Labs with elevated LFTs, cholestatic pattern, T bili 7.8 >> 10.3.  WBCs 6.5 >> 14.2.   Acute hepatitis serologies Negative.   Pt's pain is gone (scores 0 instead of 8-10) this AM after doses of Vanc, Cipro, Zosyn.  He feels better.  Denies n/v.  No previous issues with digestive tract or with abd pain.  No NSAIDs, ETOH.  [place]ives [place] in [place].  Brother in room with pt, both speak [place].    Past Medical History:  Diagnosis Date  . Neurogenic bladder   . Spina bifida [facility](HCC)     Past Surgical History:  Procedure Laterality Date  . ANKLE SURGERY  2001  . mass removed right leg  1998  . MENINGOCELE REPAIR  09/1991   In [place], no details  . SACRAL DECUBITUS ULCER EXCISION  2007  . VENTRICULOPERITONEAL SHUNT  1993    Prior to  Admission medications   Medication Sig Start Date End Date Taking? Authorizing Provider  oxybutynin (DITROPAN) 5 MG tablet Take 2 tablets in the morning, 1 tablet at lunch, and 2 tablets at night for bladder spasm. Patient taking differently: Take 5-10 mg by mouth See admin instructions. Take 2 tablets in the morning, 1 tablet at lunch, and 2 tablets at night for bladder spasm. 10/04/13  Yes Hector RisingKyle J Fletke, Hector Shea    Scheduled Meds: . ciprofloxacin  400 mg Intravenous Q12H  . heparin  5,000 Units Subcutaneous Q8H  . sodium chloride flush  3 mL Intravenous Q12H   Infusions: . sodium chloride 150 mL/hr at 04/28/16 0733   PRN Meds: acetaminophen **OR** acetaminophen, fentaNYL (SUBLIMAZE) injection   Allergies as of 04/27/2016 - Review Complete 04/27/2016  Allergen Reaction Noted  . Advil [ibuprofen]  02/04/2011    Family History  Problem Relation Age of Onset  . Hypertension Mother   . Hyperlipidemia Mother   . Diabetes Father     on renal dialysis    Social History   Social History  . Marital status: Single    Spouse name: N/A  . Number of children: N/A  . Years of  education: 7   Occupational History  . Not on file.   Social History Main Topics  . Smoking status: Never Smoker  . Smokeless tobacco: Never Used  . Alcohol use No  . Drug use: No  . Sexual activity: Not on file   Other Topics Concern  . Not on file   Social History Narrative   Moved to [place] From [place] with his mother to join his father and brothers in [place]   Six years of education in [place]   Currently a student in newcomers classes    REVIEW OF SYSTEMS: Constitutional:  Generally spends day in a wheelchair.  No weakness or fatigue ENT:  No nose bleeds Pulm:  No SOB or cough CV:  No palpitations, no LE edema.  GU:  No hematuria, no frequency GI:  Per HPI Heme:  No unusual bleeding or bruising   Transfusions:  None per his/family recall Neuro:  No headaches, no peripheral tingling or  numbness Derm:  No itching, no rash or sores.  Endocrine:  No sweats or chills.  No polyuria or dysuria Immunization:  Did not query    PHYSICAL EXAM: Vital signs in last 24 hours: Vitals:   04/28/16 0530 04/28/16 0648  BP: 129/86 126/82  Pulse: (!) 127   Resp: 21 18  Temp:  99.8 F (37.7 C)   Wt Readings from Last 3 Encounters:  04/28/16 63 kg (138 lb 14.2 oz)  09/11/09 68.5 kg (151 lb) (56 %, Z= 0.14)*   * Growth percentiles are based on CDC 2-20 Years data.    General: Pleasant, alert. Diaphoretic but does not look acutely ill. He is comfortable Head:  No facial asymmetry or swelling.  Eyes:  Slight scleral icterus, no conjunctival pallor. EOMI. Ears:  Not hard of hearing  Nose:  No discharge or congestion Mouth:  Clear, moist, pink oral mucosa Neck:  No JVD, no masses. Lungs:  Clear bilaterally. No cough or dyspnea Heart: Slightly tachycardia in the 1 teens, regular. S1-S2 audible. No murmurs rubs or gallops. Abdomen:  Soft. Surgical incision scar in right upper quadrant. No masses, no HSM. Active bowel sounds. Not tender. Distended.   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  Knee immobilizer type splint on the right leg. Extremities:  No CCE.  Neurologic:  Patient is alert and oriented. No tremors. Did not test limb movement or strength. Skin:  Difficult to discern jaundice due to his all of colored skin. Tattoos:  None   Psych:  Calm, cooperative, pleasant affect.  Intake/Output from previous day: 01/27 0701 - 01/28 0700 In: 2250 [IV Piggyback:2250] Out: -  Intake/Output this shift: No intake/output data recorded.  LAB RESULTS:  Recent Labs  04/26/16 1130 04/27/16 2120  WBC 6.5 14.2*  HGB 15.7 15.4  HCT 47.4 45.8  PLT 181 297   BMET Lab Results  Component Value Date   NA 133 (L) 04/27/2016   NA 138 04/26/2016   NA 141 11/13/2009   K 3.7 04/27/2016   K 4.2 04/26/2016   K 4.4 11/13/2009   CL 95  (L) 04/27/2016   CL 98 04/26/2016   CL 106 11/13/2009   CO2 23 04/27/2016   CO2 21 04/26/2016   CO2 24 11/13/2009   GLUCOSE 140 (H) 04/27/2016   GLUCOSE 72 04/26/2016   GLUCOSE 88 11/13/2009   BUN 11 04/27/2016   BUN 13 04/26/2016   BUN 21 11/13/2009   CREATININE 1.06 04/27/2016  CREATININE 0.76 04/26/2016   CREATININE 1.12 11/13/2009   CALCIUM 9.3 04/27/2016   CALCIUM 9.4 04/26/2016   CALCIUM 9.4 11/13/2009   LFT  Recent Labs  04/26/16 1130 04/28/16 0103  PROT 7.7 8.2*  ALBUMIN 3.9 3.5  AST 171* 257*  ALT 344* 419*  ALKPHOS 374* 369*  BILITOT 7.8* 10.3*  BILIDIR  --  6.4*  IBILI  --  3.9*   PT/INR No results found for: INR, PROTIME Hepatitis Panel  Recent Labs  04/26/16 1130  HEPBSAG NEGATIVE  HCVAB NEGATIVE  HEPAIGM NON REACTIVE  HEPBIGM NON REACTIVE   C-Diff No components found for: CDIFF Lipase     Component Value Date/Time   LIPASE 56 (H) 04/28/2016 0103    Drugs of Abuse  No results found for: LABOPIA, COCAINSCRNUR, LABBENZ, AMPHETMU, THCU, LABBARB   RADIOLOGY STUDIES: Dg Chest 2 View  Result Date: 04/27/2016 CLINICAL DATA:  Epigastric pain which shortness-of-breath. Possible UTI on cephalexin. Worsening abdominal pain. EXAM: CHEST  2 VIEW COMPARISON:  None. FINDINGS: Right-sided ventriculoperitoneal shunt with irregularity and discontinuity of the tubing over the right flank. Lungs are somewhat hypoinflated with mild prominence of the perihilar markings. No focal airspace consolidation or effusion. Cardiomediastinal silhouette is within normal. Mild gastric distension. IMPRESSION: Minimal prominence of the perihilar markings which may be due to the degree of hypoinflation, although cannot exclude viral bronchopneumonia or minimal vascular congestion. Right-sided ventriculoperitoneal shunt with focal irregularity/ discontinuity of the shunt over the lateral thorax. Electronically Signed   By: Elberta Fortis M.D.   On: 04/27/2016 21:55   Ct Abdomen  Pelvis W Contrast  Result Date: 04/28/2016 CLINICAL DATA:  26 year old male with hyperbilirubinemia and diffuse abdominal pain. EXAM: CT ABDOMEN AND PELVIS WITH CONTRAST TECHNIQUE: Multidetector CT imaging of the abdomen and pelvis was performed using the standard protocol following bolus administration of intravenous contrast. CONTRAST:  ISOVUE-300 IOPAMIDOL (ISOVUE-300) INJECTION 61% COMPARISON:  None. FINDINGS: Lower chest: Small right lung base subpleural density most likely represents atelectatic changes. Infiltrate is less likely but not entirely excluded. Clinical correlation is recommended. Left lung base atelectatic changes noted. No intra-abdominal free air or free fluid. Hepatobiliary: The gallbladder is distended. No calcified stone identified. There is noncalcified stone versus sludge within the gallbladder lumen. Noncalcified debris suspected within the midportion of the common bile duct as well as within the central CBD at the head of the pancreas adjacent to the ampulla. There is dilatation of the CBD measuring up to 16 mm. There is also dilatation of the intrahepatic biliary ducts. There is thickened appearance of the gallbladder wall. There is a 4.0 x 1.0 cm inflammatory collection adjacent to the gallbladder in the subhepatic area concerning for an abscess. The liver is unremarkable. Pancreas: Unremarkable. No pancreatic ductal dilatation or surrounding inflammatory changes. Spleen: Normal in size without focal abnormality. Adrenals/Urinary Tract: The adrenal glands appear unremarkable. The right kidney is atrophic. There is chronic mild dilatation of the right renal collecting system and right ureter. 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 There is mild left hydronephrosis. No obstructing stone identified. Correlation with urinalysis recommended to exclude acute UTI. The  urinary bladder is unremarkable. Stomach/Bowel: There is no evidence of bowel obstruction or active inflammation. Normal appendix. Vascular/Lymphatic: The abdominal aorta and IVC appear unremarkable. The SMV, splenic vein, and main portal vein are patent. No portal venous gas identified. There is no adenopathy. Reproductive:  The prostate and seminal vesicles are grossly unremarkable. Other: Right anterior chest wall VP shunt terminates in the subcutaneous soft tissues of the anterior abdominal wall. Right anterior pelvic wall scar. No fluid collection. Musculoskeletal: Chronic dislocation of the hips. Non fusion of the lumbar posterior elements compatible with spina bifida. There is protrusion and exposure of the CSF field thecal sac compatible with a meningocele. No acute fracture. Postsurgical changes of meningocele repair in the lower back. IMPRESSION: Dilatation of the intrahepatic and extrahepatic biliary tree as well as distended and inflamed gallbladder likely related to obstruction of the CBD with findings of cholecystitis and a small pericholecystic abscess. Associated acute cholangitis is not entirely excluded. Correlation with clinical exam and further evaluation with MRCP recommended to better evaluate for the gallbladder and CBD contents. Mild left hydronephrosis. Right renal atrophy with chronic appearing dilatation of the right renal collecting system and ureter. Correlation with urinalysis recommended to exclude acute UTI. 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 These results were called by telephone at the time of interpretation on 04/28/2016 at 4:12 am to physician assistant Hector Shea , who verbally acknowledged these results. Electronically Signed   By: Elgie Collard M.D.   On: 04/28/2016 04:18   US Abdomen Limited  Result Date: 04/28/2016 CLINICAL DATA:  Initial evaluation for jaundice. Biliary dilatation seen on prior CT. EXAM: US ABDOMEN LIMITED - RIGHT UPPER  QUADRANT COMPARISON:  Prior CT from earlier the same day. FINDINGS: Gallbladder: Multiple echogenic stones present within the gallbladder lumen, largest of which measured approximately 1 cm. Gallbladder wall measure within normal limits at 2 mm. Irregular fluid density present within knee liver adjacent to the gallbladder fossa, better seen on recent CT. No sonographic Murphy sign elicited on exam. Common bile duct: Diameter: 12 mm. No definite hyperechoic stone seen within the dilated common bile duct. Liver: Irregular fluid density within the liver adjacent to the gallbladder fossa, better seen on prior CT. Hepatic parenchyma somewhat heterogeneous. IMPRESSION: 1. Cholelithiasis with dilatation of the common bile duct up to 12 mm. 2. Fluid opacity within the hepatic parenchyma adjacent to the gallbladder fossa, better seen on recent CT. 3. Heterogeneous echotexture of the liver. Electronically Signed   By: Rise Mu M.D.   On: 04/28/2016 05:35    IMPRESSION:   *  Cholangitis, cholecystitis, peri-cholecystic abscess.  ? Is biliary tree dilated due to GB infalmmation or is there choledocholithiasis?    *  UTI.  Rods in urine.  Neurogenic bladder.   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   PLAN:     *  In reviewing current meds, the current antibiotic regimen consists solely of ciprofloxacin as it looks like the Zosyn and vancomycin were given only once. Since inventory of metronidazole is critically low, will stop the Cipro and continue with Zosyn.  *  Decision as to ERCP per Hector Marina Goodell who will see pt later today.  IF ERCP planned, it would go tomorrow and Hector Elnoria Howard will assume care and perform the ERCP.    *  Pt can have clears now, even advance diet if this is tolerated.   Jennye Moccasin  04/28/2016, 8:34 AM Pager: [phone]  GI ATTENDING  History, laboratories, x-rays reviewed. Patient personally seen and examined. Family in room.  The patient presents with ascending cholangitis secondary to choledocholithiasis. Has been hydrated and started on antibiotics. This afternoon he feels better. Vital signs stable. Recommend continuing  antibiotics. ERCP with sphincterotomy and stone extraction will be arranged with Hector. Jeani Hawking will assume the patient's GI care tomorrow. He will need cholecystectomy after bile duct clearance  Wilhemina Bonito. Eda Keys., M.D. [facility] Division of Gastroenterology

## 2016-04-29 NOTE — Op Note (Signed)
[facility] Patient Name: Hector CosJuan Varela Juarez Procedure Date : 04/29/2016 MRN: [id number] Attending MD: Jeani HawkingPatrick Jakyle Petrucelli , MD Date of Birth: [birth year] CSN: [id number] Age: 26 Admit Type: Inpatient Procedure:                ERCP Indications:              Bile duct stone(s) Providers:                Jeani HawkingPatrick Michaelpaul Apo, MD, Norman ClayLisa Nunn, RN, Kandice RobinsonsGuillaume Awaka,                            Technician Referring MD:              Medicines:                General Anesthesia Complications:            No immediate complications. Estimated Blood Loss:     Estimated blood loss was minimal. Procedure:                Pre-Anesthesia Assessment:                           - Prior to the procedure, a History and Physical                            was performed, and patient medications and                            allergies were reviewed. The patient's tolerance of                            previous anesthesia was also reviewed. The risks                            and benefits of the procedure and the sedation                            options and risks were discussed with the patient.                            All questions were answered, and informed consent                            was obtained. Prior Anticoagulants: The patient has                            taken heparin, last dose was day of procedure. ASA                            Grade Assessment: III - A patient with severe                            systemic disease. After reviewing the risks and  benefits, the patient was deemed in satisfactory                            condition to undergo the procedure.                           - Sedation was administered by an anesthesia                            professional. General anesthesia was attained.                           After obtaining informed consent, the scope was                            passed under direct vision. Throughout the            procedure, the patient's blood pressure, pulse, and                            oxygen saturations were monitored continuously. The                            [id number]) scope was introduced through                            the mouth, and used to inject contrast into and                            used to cannulate the bile duct. The ERCP was                            technically difficult and complex. The patient                            tolerated the procedure well. Findings:      The biliary tree was swept with a 12 mm balloon starting at the       bifurcation. Many stones were removed. No stones remained.      the patient had a very J-shaped stomach. It was difficult to pass the       duodenoscope through this area. This also affect the positioning of the       endoscope to view the papilla. With multiple scope insertions and       reductions was the endoscope able to be striaghtened into the short       position, but even in this position, holding the position was tenuous.       The CBD was easily cannulated, but the sphincterotomy was inadequate.       Again, the anatomy precluded the ability to extend the sphincterotomy       beyond 5 mm. A 1 cm dilation balloon was then used the open up the       ampulla. This subsequently allowed for easy sweep of the CBD with       multiple stones being extracted. No stones were remaining with the  occlusion cholangiogram. Impression:               - Choledocholithiasis was found. Complete removal                            was accomplished by balloon extraction.                           - The biliary tree was swept. Recommendation:           - Return patient to hospital ward for ongoing care.                           - Clear liquid diet.                           - Continue present medications.                           - Cholecystectomy per Surgery. Procedure Code(s):        --- Professional ---                            [phone], Endoscopic retrograde                            cholangiopancreatography (ERCP); with removal of                            calculi/debris from biliary/pancreatic duct(s) Diagnosis Code(s):        --- Professional ---                           K80.50, Calculus of bile duct without cholangitis                            or cholecystitis without obstruction CPT copyright 2016 American Medical Association. All rights reserved. The codes documented in this report are preliminary and upon coder review may  be revised to meet current compliance requirements. Jeani Hawking, MD Jeani Hawking, MD 04/29/2016 1:32:19 PM This report has been signed electronically. Number of Addenda: 0

## 2016-04-29 NOTE — Anesthesia Postprocedure Evaluation (Signed)
Anesthesia Post Note  Patient: Hector Shea  Procedure(s) Performed: Procedure(s) (LRB): ENDOSCOPIC RETROGRADE CHOLANGIOPANCREATOGRAPHY (ERCP) WITH PROPOFOL (N/A)  Patient location during evaluation: PACU Anesthesia Type: General Level of consciousness: awake Pain management: pain level controlled Vital Signs Assessment: post-procedure vital signs reviewed and stable Respiratory status: spontaneous breathing Cardiovascular status: stable Anesthetic complications: no       Last Vitals:  Vitals:   04/29/16 1343 04/29/16 1345  BP: (!) 144/83 (!) 148/69  Pulse: (!) 128 (!) 110  Resp: 19 17  Temp: 36.9 C     Last Pain:  Vitals:   04/29/16 1343  TempSrc: Oral  PainSc:                  [name]

## 2016-04-29 NOTE — Interval H&P Note (Signed)
History and Physical Interval Note:  04/29/2016 12:10 PM  Hector Shea  has presented today for surgery, with the diagnosis of Bile duct stone, cholangitis.  The various methods of treatment have been discussed with the patient and family. After consideration of risks, benefits and other options for treatment, the patient has consented to  Procedure(s): ENDOSCOPIC RETROGRADE CHOLANGIOPANCREATOGRAPHY (ERCP) WITH PROPOFOL (N/A) as a surgical intervention .  The patient's history has been reviewed, patient examined, no change in status, stable for surgery.  I have reviewed the patient's chart and labs.  Questions were answered to the patient's satisfaction.     Lekeshia Kram D

## 2016-04-29 NOTE — Transfer of Care (Signed)
Immediate Anesthesia Transfer of Care Note  Patient: Hector Shea  Procedure(s) Performed: Procedure(s): ENDOSCOPIC RETROGRADE CHOLANGIOPANCREATOGRAPHY (ERCP) WITH PROPOFOL (N/A)  Patient Location: Endoscopy Unit  Anesthesia Type:General  Level of Consciousness: awake, alert , oriented and patient cooperative  Airway & Oxygen Therapy: Patient Spontanous Breathing and Patient connected to nasal cannula oxygen  Post-op Assessment: Report given to RN and Post -op Vital signs reviewed and stable  Post vital signs: Reviewed and stable  Last Vitals:  Vitals:   04/29/16 1145 04/29/16 1343  BP: (!) 148/76 (!) 144/83  Pulse: (!) 135 (!) 128  Resp: 19 19  Temp: 37 C 36.9 C    Last Pain:  Vitals:   04/29/16 1343  TempSrc: Oral  PainSc:          Complications: No apparent anesthesia complications

## 2016-04-29 NOTE — Progress Notes (Signed)
  Progress Note: General Surgery Service   Subjective: Underwent ERCP with successful removal of choledocholithiasis  Objective: Vital signs in last 24 hours: Temp:  [98.1 F (36.7 C)-99.2 F (37.3 C)] 98.1 F (36.7 C) (01/29 1426) Pulse Rate:  [86-135] 86 (01/29 1426) Resp:  [12-27] 17 (01/29 1426) BP: (115-148)/(68-90) 138/83 (01/29 1426) SpO2:  [96 %-99 %] 98 % (01/29 1426) Weight:  [63 kg (138 lb 14.2 oz)] 63 kg (138 lb 14.2 oz) (01/29 1145) Last BM Date:  (PTA)  Intake/Output from previous day: 01/28 0701 - 01/29 0700 In: 3517.5 [I.V.:3367.5; IV Piggyback:150] Out: 1650 [Urine:1650] Intake/Output this shift: Total I/O In: 950 [I.V.:900; IV Piggyback:50] Out: 5 [Blood:5]  Abd: soft, tender to palpation in right upper quadrant   Neuro: AOx4  Lab Results: CBC   Recent Labs  04/27/16 2120 04/29/16 0250  WBC 14.2* 8.6  HGB 15.4 12.5*  HCT 45.8 37.3*  PLT 297 289   BMET  Recent Labs  04/29/16 0250 04/29/16 0946  NA 138 138  K 3.6 3.7  CL 107 105  CO2 21* 17*  GLUCOSE 62* 52*  BUN 6 7  CREATININE 0.82 0.96  CALCIUM 8.2* 8.4*   PT/INR No results for input(s): LABPROT, INR in the last 72 hours. ABG No results for input(s): PHART, HCO3 in the last 72 hours.  Invalid input(s): PCO2, PO2  Studies/Results:  Anti-infectives: Anti-infectives    Start     Dose/Rate Route Frequency Ordered Stop   04/28/16 1400  piperacillin-tazobactam (ZOSYN) IVPB 3.375 g  Status:  Discontinued     3.375 g 100 mL/hr over 30 Minutes Intravenous Every 8 hours 04/28/16 1006 04/28/16 1007   04/28/16 1030  piperacillin-tazobactam (ZOSYN) IVPB 3.375 g     3.375 g 12.5 mL/hr over 240 Minutes Intravenous Every 8 hours 04/28/16 1008     04/28/16 0730  ciprofloxacin (CIPRO) IVPB 400 mg  Status:  Discontinued     400 mg 200 mL/hr over 60 Minutes Intravenous Every 12 hours 04/28/16 0657 04/28/16 1006   04/28/16 0215  vancomycin (VANCOCIN) IVPB 1000 mg/200 mL premix     1,000  mg 200 mL/hr over 60 Minutes Intravenous  Once 04/28/16 0211 04/28/16 0451   04/28/16 0215  piperacillin-tazobactam (ZOSYN) IVPB 3.375 g     3.375 g 100 mL/hr over 30 Minutes Intravenous  Once 04/28/16 0211 04/28/16 0317      Medications: Scheduled Meds: . heparin  5,000 Units Subcutaneous Q8H  . piperacillin-tazobactam (ZOSYN)  IV  3.375 g Intravenous Q8H  . sodium chloride flush  3 mL Intravenous Q12H   Continuous Infusions: . sodium chloride 150 mL/hr at 04/29/16 0953   PRN Meds:.acetaminophen **OR** acetaminophen, fentaNYL (SUBLIMAZE) injection  Assessment/Plan: Patient Active Problem List   Diagnosis Date Noted  . 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 Cholangitis 04/28/2016  . Cholecystitis   . Tachycardia   . Abnormal urine 04/26/2016  . Preventative health care 10/18/2014  . Presence of ventricular shunt 07/27/2012  . NYSTAGMUS 11/13/2009  . PARAPARESIS 09/11/2009  . 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 Urinary incontinence without sensory awareness 09/11/2009   s/p Procedure(s): ENDOSCOPIC RETROGRADE CHOLANGIOPANCREATOGRAPHY (ERCP) WITH PROPOFOL 04/29/2016 -will recheck labs in am -likely OR tomorrow afternoon   LOS: 1 day   Hector Shea Aaron Afnan Emberton, MD Pg# [phone] [facility]   

## 2016-04-29 NOTE — Progress Notes (Signed)
Pt back from ERCP. Complaining of right hearing loss. MD paged and came to assess. Stated no correlation to hearing loss and procedure. RN will continue to monitor.

## 2016-04-29 NOTE — Anesthesia Preprocedure Evaluation (Signed)
Anesthesia Evaluation  Patient identified by MRN, date of birth, ID band Patient awake    Reviewed: Allergy & Precautions, NPO status , Patient's Chart, lab work & pertinent test results  Airway Mallampati: II  TM Distance: >3 FB     Dental   Pulmonary neg pulmonary ROS,    breath sounds clear to auscultation       Cardiovascular negative cardio ROS   Rhythm:Regular Rate:Normal     Neuro/Psych    GI/Hepatic Neg liver ROS, GI history noted. CG   Endo/Other  negative endocrine ROS  Renal/GU negative Renal ROS     Musculoskeletal   Abdominal   Peds  Hematology   Anesthesia Other Findings   Reproductive/Obstetrics                             Anesthesia Physical Anesthesia Plan  ASA: III  Anesthesia Plan: General   Post-op Pain Management:    Induction: Intravenous  Airway Management Planned:   Additional Equipment:   Intra-op Plan:   Post-operative Plan: Extubation in OR  Informed Consent: I have reviewed the patients History and Physical, chart, labs and discussed the procedure including the risks, benefits and alternatives for the proposed anesthesia with the patient or authorized representative who has indicated his/her understanding and acceptance.   Dental advisory given  Plan Discussed with: CRNA and Anesthesiologist  Anesthesia Plan Comments:         Anesthesia Quick Evaluation

## 2016-04-30 ENCOUNTER — Inpatient Hospital Stay (HOSPITAL_COMMUNITY): Payer: Medicare Other | Admitting: Certified Registered Nurse Anesthetist

## 2016-04-30 ENCOUNTER — Encounter (HOSPITAL_COMMUNITY): Payer: Self-pay | Admitting: Gastroenterology

## 2016-04-30 ENCOUNTER — Encounter (HOSPITAL_COMMUNITY)
Admission: EM | Disposition: A | Discharge status: Home / Self Care | Payer: Self-pay | Source: Home / Self Care | Attending: Family Medicine

## 2016-04-30 HISTORY — PX: CHOLECYSTECTOMY: SHX55

## 2016-04-30 LAB — COMPREHENSIVE METABOLIC PANEL
ALBUMIN: 2.4 g/dL — AB (ref 3.5–5.0)
ALT: 174 U/L — ABNORMAL HIGH (ref 17–63)
ANION GAP: 11 (ref 5–15)
AST: 43 U/L — ABNORMAL HIGH (ref 15–41)
Alkaline Phosphatase: 215 U/L — ABNORMAL HIGH (ref 38–126)
BUN: 12 mg/dL (ref 6–20)
CO2: 19 mmol/L — AB (ref 22–32)
Calcium: 8.5 mg/dL — ABNORMAL LOW (ref 8.9–10.3)
Chloride: 110 mmol/L (ref 101–111)
Creatinine, Ser: 0.91 mg/dL (ref 0.61–1.24)
GFR calc Af Amer: >60 mL/min (ref >60)
GFR calc non Af Amer: >60 mL/min (ref >60)
GLUCOSE: 114 mg/dL — AB (ref 65–99)
Potassium: 4.1 mmol/L (ref 3.5–5.1)
SODIUM: 140 mmol/L (ref 135–145)
TOTAL PROTEIN: 6.3 g/dL — AB (ref 6.5–8.1)
Total Bilirubin: 3.6 mg/dL — ABNORMAL HIGH (ref 0.3–1.2)

## 2016-04-30 LAB — URINE CULTURE

## 2016-04-30 LAB — CBC
HCT: 39.4 % (ref 39.0–52.0)
Hemoglobin: 12.6 g/dL — ABNORMAL LOW (ref 13.0–17.0)
MCH: 28.8 pg (ref 26.0–34.0)
MCHC: 32 g/dL (ref 30.0–36.0)
MCV: 90.2 fL (ref 78.0–100.0)
Platelets: 373 10*3/uL (ref 150–400)
RBC: 4.37 MIL/uL (ref 4.22–5.81)
RDW: 15.6 % — AB (ref 11.5–15.5)
WBC: 8 10*3/uL (ref 4.0–10.5)

## 2016-04-30 LAB — GLUCOSE, CAPILLARY
GLUCOSE-CAPILLARY: 81 mg/dL (ref 65–99)
Glucose-Capillary: 120 mg/dL — ABNORMAL HIGH (ref 65–99)

## 2016-04-30 LAB — LIPASE, BLOOD: Lipase: 40 U/L (ref 11–51)

## 2016-04-30 SURGERY — LAPAROSCOPIC CHOLECYSTECTOMY
Anesthesia: General | Site: Abdomen

## 2016-04-30 MED ORDER — ONDANSETRON HCL 4 MG/2ML IJ SOLN
INTRAMUSCULAR | Status: AC
  Filled 2016-04-30: qty 2

## 2016-04-30 MED ORDER — SUGAMMADEX SODIUM 200 MG/2ML IV SOLN
INTRAVENOUS | Status: AC
  Filled 2016-04-30: qty 2

## 2016-04-30 MED ORDER — PHENYLEPHRINE 40 MCG/ML (10ML) SYRINGE FOR IV PUSH (FOR BLOOD PRESSURE SUPPORT)
PREFILLED_SYRINGE | INTRAVENOUS | Status: AC
  Filled 2016-04-30: qty 10

## 2016-04-30 MED ORDER — ROCURONIUM BROMIDE 50 MG/5ML IV SOSY
PREFILLED_SYRINGE | INTRAVENOUS | Status: AC
  Filled 2016-04-30: qty 10

## 2016-04-30 MED ORDER — LACTATED RINGERS IV SOLN
INTRAVENOUS | Status: DC
Start: 2016-04-30 — End: 2016-04-30

## 2016-04-30 MED ORDER — SUGAMMADEX SODIUM 500 MG/5ML IV SOLN
INTRAVENOUS | Status: DC | PRN
  Administered 2016-04-30: 130 mg via INTRAVENOUS

## 2016-04-30 MED ORDER — LIDOCAINE 2% (20 MG/ML) 5 ML SYRINGE
INTRAMUSCULAR | Status: AC
  Filled 2016-04-30: qty 5

## 2016-04-30 MED ORDER — FENTANYL CITRATE (PF) 100 MCG/2ML IJ SOLN
INTRAMUSCULAR | Status: AC
  Filled 2016-04-30: qty 2

## 2016-04-30 MED ORDER — SUGAMMADEX SODIUM 500 MG/5ML IV SOLN
INTRAVENOUS | Status: AC
  Filled 2016-04-30: qty 5

## 2016-04-30 MED ORDER — LIDOCAINE HCL (CARDIAC) 20 MG/ML IV SOLN
INTRAVENOUS | Status: DC | PRN
  Administered 2016-04-30: 40 mg via INTRAVENOUS

## 2016-04-30 MED ORDER — PIPERACILLIN-TAZOBACTAM 3.375 G IVPB
3.3750 g | Freq: Three times a day (TID) | INTRAVENOUS | Status: AC
  Administered 2016-04-30 – 2016-05-01 (×2): 3.375 g via INTRAVENOUS
  Filled 2016-04-30 (×2): qty 50

## 2016-04-30 MED ORDER — KETOROLAC TROMETHAMINE 30 MG/ML IJ SOLN
INTRAMUSCULAR | Status: AC
Start: 2016-04-30 — End: 2016-04-30
  Filled 2016-04-30: qty 2

## 2016-04-30 MED ORDER — DEXAMETHASONE SODIUM PHOSPHATE 10 MG/ML IJ SOLN
INTRAMUSCULAR | Status: AC
  Filled 2016-04-30: qty 1

## 2016-04-30 MED ORDER — SODIUM CHLORIDE 0.9 % IV SOLN
INTRAVENOUS | Status: DC
  Administered 2016-04-30 – 2016-05-03 (×8): via INTRAVENOUS
  Administered 2016-05-03: 1000 mL via INTRAVENOUS

## 2016-04-30 MED ORDER — BUPIVACAINE-EPINEPHRINE 0.25% -1:200000 IJ SOLN
INTRAMUSCULAR | Status: DC | PRN
  Administered 2016-04-30: 20 mL

## 2016-04-30 MED ORDER — OXYCODONE HCL 5 MG PO TABS
5.0000 mg | ORAL_TABLET | ORAL | Status: DC | PRN
  Administered 2016-05-01 (×4): 5 mg via ORAL
  Administered 2016-05-02 – 2016-05-03 (×4): 10 mg via ORAL
  Filled 2016-04-30 (×3): qty 1
  Filled 2016-04-30 (×3): qty 2
  Filled 2016-04-30 (×2): qty 1
  Filled 2016-04-30: qty 2

## 2016-04-30 MED ORDER — FENTANYL CITRATE (PF) 100 MCG/2ML IJ SOLN
INTRAMUSCULAR | Status: DC | PRN
  Administered 2016-04-30 (×2): 50 ug via INTRAVENOUS
  Administered 2016-04-30: 25 ug via INTRAVENOUS
  Administered 2016-04-30 (×2): 50 ug via INTRAVENOUS
  Administered 2016-04-30: 25 ug via INTRAVENOUS
  Administered 2016-04-30: 50 ug via INTRAVENOUS

## 2016-04-30 MED ORDER — OXYCODONE HCL 5 MG/5ML PO SOLN: 5.0000 mg | Freq: Once | ORAL | Status: DC | PRN

## 2016-04-30 MED ORDER — IOPAMIDOL (ISOVUE-300) INJECTION 61%
INTRAVENOUS | Status: AC
  Filled 2016-04-30: qty 50

## 2016-04-30 MED ORDER — ROCURONIUM BROMIDE 50 MG/5ML IV SOSY
PREFILLED_SYRINGE | INTRAVENOUS | Status: AC
  Filled 2016-04-30: qty 15

## 2016-04-30 MED ORDER — ROCURONIUM BROMIDE 100 MG/10ML IV SOLN
INTRAVENOUS | Status: DC | PRN
  Administered 2016-04-30: 20 mg via INTRAVENOUS
  Administered 2016-04-30: 40 mg via INTRAVENOUS
  Administered 2016-04-30: 10 mg via INTRAVENOUS

## 2016-04-30 MED ORDER — FENTANYL CITRATE (PF) 100 MCG/2ML IJ SOLN: 25.0000 ug | INTRAMUSCULAR | Status: DC | PRN

## 2016-04-30 MED ORDER — LACTATED RINGERS IV SOLN
INTRAVENOUS | Status: DC
  Administered 2016-04-30 (×2): via INTRAVENOUS

## 2016-04-30 MED ORDER — OXYCODONE HCL 5 MG PO TABS: 5.0000 mg | ORAL_TABLET | Freq: Once | ORAL | Status: DC | PRN

## 2016-04-30 MED ORDER — DIPHENHYDRAMINE HCL 50 MG/ML IJ SOLN
12.5000 mg | Freq: Three times a day (TID) | INTRAMUSCULAR | Status: DC | PRN
Start: 2016-04-30 — End: 2016-05-06

## 2016-04-30 MED ORDER — SODIUM CHLORIDE 0.9 % IR SOLN
Status: DC | PRN
  Administered 2016-04-30 (×2): 1000 mL

## 2016-04-30 MED ORDER — ONDANSETRON HCL 4 MG/2ML IJ SOLN: 4.0000 mg | Freq: Four times a day (QID) | INTRAMUSCULAR | Status: DC | PRN

## 2016-04-30 MED ORDER — EPHEDRINE 5 MG/ML INJ
INTRAVENOUS | Status: AC
  Filled 2016-04-30: qty 10

## 2016-04-30 MED ORDER — MIDAZOLAM HCL 5 MG/5ML IJ SOLN
INTRAMUSCULAR | Status: DC | PRN
  Administered 2016-04-30: 2 mg via INTRAVENOUS

## 2016-04-30 MED ORDER — BUPIVACAINE-EPINEPHRINE (PF) 0.5% -1:200000 IJ SOLN
INTRAMUSCULAR | Status: AC
  Filled 2016-04-30: qty 30

## 2016-04-30 MED ORDER — 0.9 % SODIUM CHLORIDE (POUR BTL) OPTIME
TOPICAL | Status: DC | PRN
  Administered 2016-04-30: 1000 mL

## 2016-04-30 MED ORDER — PROPOFOL 10 MG/ML IV BOLUS
INTRAVENOUS | Status: AC
  Filled 2016-04-30: qty 20

## 2016-04-30 MED ORDER — FENTANYL CITRATE (PF) 100 MCG/2ML IJ SOLN
INTRAMUSCULAR | Status: AC
  Filled 2016-04-30: qty 4

## 2016-04-30 MED ORDER — ETOMIDATE 2 MG/ML IV SOLN
INTRAVENOUS | Status: AC
  Filled 2016-04-30: qty 10

## 2016-04-30 MED ORDER — MIDAZOLAM HCL 2 MG/2ML IJ SOLN
INTRAMUSCULAR | Status: AC
  Filled 2016-04-30: qty 2

## 2016-04-30 MED ORDER — HEPARIN SODIUM (PORCINE) 5000 UNIT/ML IJ SOLN
5000.0000 [IU] | Freq: Three times a day (TID) | INTRAMUSCULAR | Status: DC
  Administered 2016-04-30 – 2016-05-06 (×18): 5000 [IU] via SUBCUTANEOUS
  Filled 2016-04-30 (×15): qty 1

## 2016-04-30 MED ORDER — ONDANSETRON HCL 4 MG/2ML IJ SOLN
INTRAMUSCULAR | Status: DC | PRN
  Administered 2016-04-30: 4 mg via INTRAVENOUS

## 2016-04-30 MED ORDER — PROPOFOL 10 MG/ML IV BOLUS
INTRAVENOUS | Status: DC | PRN
  Administered 2016-04-30: 200 mg via INTRAVENOUS

## 2016-04-30 SURGICAL SUPPLY — 50 items
APPLIER CLIP 5 13 M/L LIGAMAX5 (MISCELLANEOUS) ×3
BANDAGE ADH SHEER 1  50/CT (GAUZE/BANDAGES/DRESSINGS) ×9 IMPLANT
BENZOIN TINCTURE PRP APPL 2/3 (GAUZE/BANDAGES/DRESSINGS) ×3 IMPLANT
BIOPATCH RED 1 DISK 7.0 (GAUZE/BANDAGES/DRESSINGS) ×3 IMPLANT
BLADE SURG ROTATE 9660 (MISCELLANEOUS) IMPLANT
CANISTER SUCTION 2500CC (MISCELLANEOUS) ×3 IMPLANT
CHLORAPREP W/TINT 26ML (MISCELLANEOUS) ×3 IMPLANT
CLIP APPLIE 5 13 M/L LIGAMAX5 (MISCELLANEOUS) ×2 IMPLANT
COVER MAYO STAND STRL (DRAPES) ×3 IMPLANT
COVER SURGICAL LIGHT HANDLE (MISCELLANEOUS) ×3 IMPLANT
DEVICE PMI PUNCTURE CLOSURE (MISCELLANEOUS) ×3 IMPLANT
DRAIN CHANNEL 19F RND (DRAIN) ×3 IMPLANT
DRAPE C-ARM 42X72 X-RAY (DRAPES) IMPLANT
DRSG TEGADERM 2-3/8X2-3/4 SM (GAUZE/BANDAGES/DRESSINGS) ×6 IMPLANT
DRSG TEGADERM 4X4.75 (GAUZE/BANDAGES/DRESSINGS) ×3 IMPLANT
ELECT REM PT RETURN 9FT ADLT (ELECTROSURGICAL) ×3
ELECTRODE REM PT RTRN 9FT ADLT (ELECTROSURGICAL) ×2 IMPLANT
EVACUATOR SILICONE 100CC (DRAIN) ×3 IMPLANT
GAUZE SPONGE 2X2 8PLY STRL LF (GAUZE/BANDAGES/DRESSINGS) ×2 IMPLANT
GLOVE BIOGEL M STRL SZ7.5 (GLOVE) ×9 IMPLANT
GLOVE BIOGEL PI IND STRL 8 (GLOVE) ×4 IMPLANT
GLOVE BIOGEL PI INDICATOR 8 (GLOVE) ×2
GOWN STRL REUS W/ TWL LRG LVL3 (GOWN DISPOSABLE) ×6 IMPLANT
GOWN STRL REUS W/TWL 2XL LVL3 (GOWN DISPOSABLE) ×3 IMPLANT
GOWN STRL REUS W/TWL LRG LVL3 (GOWN DISPOSABLE) ×3
GRASPER SUT TROCAR 14GX15 (MISCELLANEOUS) IMPLANT
KIT BASIN OR (CUSTOM PROCEDURE TRAY) ×3 IMPLANT
KIT ROOM TURNOVER OR (KITS) ×3 IMPLANT
NS IRRIG 1000ML POUR BTL (IV SOLUTION) ×3 IMPLANT
PAD ARMBOARD 7.5X6 YLW CONV (MISCELLANEOUS) ×3 IMPLANT
POUCH RETRIEVAL ECOSAC 10 (ENDOMECHANICALS) ×2 IMPLANT
POUCH RETRIEVAL ECOSAC 10MM (ENDOMECHANICALS) ×1
SCISSORS LAP 5X35 DISP (ENDOMECHANICALS) ×3 IMPLANT
SET CHOLANGIOGRAPH 5 50 .035 (SET/KITS/TRAYS/PACK) ×3 IMPLANT
SET IRRIG TUBING LAPAROSCOPIC (IRRIGATION / IRRIGATOR) ×3 IMPLANT
SLEEVE ENDOPATH XCEL 5M (ENDOMECHANICALS) ×6 IMPLANT
SPECIMEN JAR SMALL (MISCELLANEOUS) ×3 IMPLANT
SPONGE GAUZE 2X2 STER 10/PKG (GAUZE/BANDAGES/DRESSINGS) ×1
STRIP CLOSURE SKIN 1/2X4 (GAUZE/BANDAGES/DRESSINGS) ×3 IMPLANT
SUT ETHILON 2 0 FS 18 (SUTURE) ×3 IMPLANT
SUT MNCRL AB 4-0 PS2 18 (SUTURE) ×3 IMPLANT
SUT MON AB 4-0 PC3 18 (SUTURE) ×3 IMPLANT
SUT VICRYL 0 UR6 27IN ABS (SUTURE) ×12 IMPLANT
TOWEL OR 17X24 6PK STRL BLUE (TOWEL DISPOSABLE) ×3 IMPLANT
TOWEL OR 17X26 10 PK STRL BLUE (TOWEL DISPOSABLE) IMPLANT
TRAY LAPAROSCOPIC MC (CUSTOM PROCEDURE TRAY) ×3 IMPLANT
TROCAR XCEL 12X100 BLDLESS (ENDOMECHANICALS) ×3 IMPLANT
TROCAR XCEL BLUNT TIP 100MML (ENDOMECHANICALS) ×3 IMPLANT
TROCAR XCEL NON-BLD 5MMX100MML (ENDOMECHANICALS) ×3 IMPLANT
TUBING INSUFFLATION (TUBING) ×3 IMPLANT

## 2016-04-30 NOTE — Anesthesia Procedure Notes (Signed)
Procedure Name: Intubation Date/Time: 04/30/2016 11:56 AM Performed by: Valda Favia Pre-anesthesia Checklist: Patient identified, Emergency Drugs available, Suction available, Patient being monitored and Timeout performed Patient Re-evaluated:Patient Re-evaluated prior to inductionOxygen Delivery Method: Circle system utilized Preoxygenation: Pre-oxygenation with 100% oxygen Intubation Type: IV induction Ventilation: Mask ventilation without difficulty Laryngoscope Size: Mac and 4 Grade View: Grade I Tube type: Oral Tube size: 7.5 mm Number of attempts: 1 Airway Equipment and Method: Stylet Placement Confirmation: ETT inserted through vocal cords under direct vision,  positive ETCO2 and breath sounds checked- equal and bilateral Secured at: 21 cm Tube secured with: Tape Dental Injury: Teeth and Oropharynx as per pre-operative assessment

## 2016-04-30 NOTE — Interval H&P Note (Signed)
History and Physical Interval Note:  04/30/2016 11:36 AM  Lars MageJuan Maretta BeesManuel Varela Juarez  has presented today for surgery, with the diagnosis of Choledocholitis  The various methods of treatment have been discussed with the patient and family. After consideration of risks, benefits and other options for treatment, the patient has consented to  Procedure(s): LAPAROSCOPIC CHOLECYSTECTOMY WITH INTRAOPERATIVE CHOLANGIOGRAM (N/A) as a surgical intervention .  The patient's history has been reviewed, patient examined, no change in status, stable for surgery.  I have reviewed the patient's chart and labs.  Questions were answered to the patient's satisfaction.    I believe the patient's symptoms are consistent with gallbladder disease.   I discussed laparoscopic cholecystectomy with possible IOC in detail.  The patient was shown diagrams detailing the procedure.  We discussed the risks and benefits of a laparoscopic cholecystectomy including, but not limited to bleeding, infection, injury to surrounding structures such as the intestine or liver, bile leak, retained gallstones, need to convert to an open procedure, prolonged diarrhea, blood clots such as  DVT, common bile duct injury, anesthesia risks, and possible need for additional procedures.  We discussed the typical post-operative recovery course. I explained that the likelihood of improvement of their symptoms is good.  All of this was discussed with spanish interpreter over phone  Mary SellaEric M. Andrey CampanileWilson, MD, FACS General, Bariatric, & Minimally Invasive Surgery [facility] [place]   [facility] Surgical Services LtdWILSON,Macedonio Scallon M

## 2016-04-30 NOTE — Transfer of Care (Signed)
Immediate Anesthesia Transfer of Care Note  Patient: Hector Shea  Procedure(s) Performed: Procedure(s): LAPAROSCOPIC CHOLECYSTECTOMY (N/A)  Patient Location: PACU  Anesthesia Type:General  Level of Consciousness: awake, alert  and oriented  Airway & Oxygen Therapy: Patient Spontanous Breathing and Patient connected to nasal cannula oxygen  Post-op Assessment: Report given to RN and Post -op Vital signs reviewed and stable  Post vital signs: Reviewed and stable  Last Vitals:  Vitals:   04/30/16 1000 04/30/16 1402  BP: 133/87   Pulse: 96   Resp: 20   Temp:  36.5 C    Last Pain:  Vitals:   04/30/16 0728  TempSrc: Oral  PainSc:          Complications: No apparent anesthesia complications

## 2016-04-30 NOTE — H&P (View-Only) (Signed)
  Progress Note: General Surgery Service   Subjective: Underwent ERCP with successful removal of choledocholithiasis  Objective: Vital signs in last 24 hours: Temp:  [98.1 F (36.7 C)-99.2 F (37.3 C)] 98.1 F (36.7 C) (01/29 1426) Pulse Rate:  [86-135] 86 (01/29 1426) Resp:  [12-27] 17 (01/29 1426) BP: (115-148)/(68-90) 138/83 (01/29 1426) SpO2:  [96 %-99 %] 98 % (01/29 1426) Weight:  [63 kg (138 lb 14.2 oz)] 63 kg (138 lb 14.2 oz) (01/29 1145) Last BM Date:  (PTA)  Intake/Output from previous day: 01/28 0701 - 01/29 0700 In: 3517.5 [I.V.:3367.5; IV Piggyback:150] Out: 1650 [Urine:1650] Intake/Output this shift: Total I/O In: 950 [I.V.:900; IV Piggyback:50] Out: 5 [Blood:5]  Abd: soft, tender to palpation in right upper quadrant   Neuro: AOx4  Lab Results: CBC   Recent Labs  04/27/16 2120 04/29/16 0250  WBC 14.2* 8.6  HGB 15.4 12.5*  HCT 45.8 37.3*  PLT 297 289   BMET  Recent Labs  04/29/16 0250 04/29/16 0946  NA 138 138  K 3.6 3.7  CL 107 105  CO2 21* 17*  GLUCOSE 62* 52*  BUN 6 7  CREATININE 0.82 0.96  CALCIUM 8.2* 8.4*   PT/INR No results for input(s): LABPROT, INR in the last 72 hours. ABG No results for input(s): PHART, HCO3 in the last 72 hours.  Invalid input(s): PCO2, PO2  Studies/Results:  Anti-infectives: Anti-infectives    Start     Dose/Rate Route Frequency Ordered Stop   04/28/16 1400  piperacillin-tazobactam (ZOSYN) IVPB 3.375 g  Status:  Discontinued     3.375 g 100 mL/hr over 30 Minutes Intravenous Every 8 hours 04/28/16 1006 04/28/16 1007   04/28/16 1030  piperacillin-tazobactam (ZOSYN) IVPB 3.375 g     3.375 g 12.5 mL/hr over 240 Minutes Intravenous Every 8 hours 04/28/16 1008     04/28/16 0730  ciprofloxacin (CIPRO) IVPB 400 mg  Status:  Discontinued     400 mg 200 mL/hr over 60 Minutes Intravenous Every 12 hours 04/28/16 0657 04/28/16 1006   04/28/16 0215  vancomycin (VANCOCIN) IVPB 1000 mg/200 mL premix     1,000  mg 200 mL/hr over 60 Minutes Intravenous  Once 04/28/16 0211 04/28/16 0451   04/28/16 0215  piperacillin-tazobactam (ZOSYN) IVPB 3.375 g     3.375 g 100 mL/hr over 30 Minutes Intravenous  Once 04/28/16 0211 04/28/16 0317      Medications: Scheduled Meds: . heparin  5,000 Units Subcutaneous Q8H  . piperacillin-tazobactam (ZOSYN)  IV  3.375 g Intravenous Q8H  . sodium chloride flush  3 mL Intravenous Q12H   Continuous Infusions: . sodium chloride 150 mL/hr at 04/29/16 0953   PRN Meds:.acetaminophen **OR** acetaminophen, fentaNYL (SUBLIMAZE) injection  Assessment/Plan: Patient Active Problem List   Diagnosis Date Noted  . 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 Cholangitis 04/28/2016  . Cholecystitis   . Tachycardia   . Abnormal urine 04/26/2016  . Preventative health care 10/18/2014  . Presence of ventricular shunt 07/27/2012  . NYSTAGMUS 11/13/2009  . PARAPARESIS 09/11/2009  . 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 Urinary incontinence without sensory awareness 09/11/2009   s/p Procedure(s): ENDOSCOPIC RETROGRADE CHOLANGIOPANCREATOGRAPHY (ERCP) WITH PROPOFOL 04/29/2016 -will recheck labs in am -likely OR tomorrow afternoon   LOS: 1 day   Rodman PickleLuke Aaron Roselie Cirigliano, MD Pg# [phone] [facility]

## 2016-04-30 NOTE — Progress Notes (Signed)
Family Medicine Teaching Service Daily Progress Note Intern Pager: [phone]  Patient name: Hector Shea Medical record number: [id number] Date of birth: [birth year] Age: 26 y.o. Gender: male  Primary Care Provider: [name], MD Consultants: Gastroenterology, general surgery Code Status: Full  Pt Overview and Major Events to Date:  01/28: Admit for RUQ pain concerning for acute cholangitis 01/29: ERCP for ascending cholangitis 01/30: OR for cholecystectomy  Assessment and Plan: Hector Shea is a 26 y.o. male presenting with worsening abdominal pain. PMH is significant for spina bifida with hydrocephalus, ventricular shunt, and neurogenic bladder with incontinence.  #Ascending Cholangitis: Acute, improved. Upper quadrant pain. CT with evidence of dilation of the biliary tree. Lipase only mildly elevated on admission. Liver enzymes and bilirubin improving since admission. Leukocytosis resolved. Vitals remained stable and afebrile. GI performed ERCP on 1/29 with removal of many stones. Patient to undergo cholecystectomy in OR today. --Gastroenterology consulted, appreciate recommendations --General surgery consulted, appreciate recommendations --Percutaneous decompression may be necessary --IV Zosyn per GI recommendations (day 3, 1/28>) --CMET daily --Blood cultures NGTD --NPO --Fentanyl as needed for pain (avoid morphine due to effects on Sphincter of Oddi)  #Tachycardia: Acute, Resolved. Presented 120s to 150s. Likely secondary to pain. --Control pain; fentanyl every 2 hours as needed --IV NS at 150 mL/hour  #Neurogenic bladder with incontinence: 2/2 spina bifida. In and out cath at home --Foley due to anticipation for possible procedures  FEN/GI: NS IV @150ml /hr; NPO Prophylaxis: Heparin SQ  Disposition: Pending cholecystectomy s/p ERCP.  Subjective:  Patient states overall he is doing well. Denies pain at present saying he feels much better  since his surgery yesterday. Denies nausea or vomiting, fevers or chills, dyspnea or chest pain. Patient previously had loss of right sided hearing after surgery but has resolved this morning.  Objective: Temp:  [97.7 F (36.5 C)-98.9 F (37.2 C)] 97.7 F (36.5 C) (01/30 0400) Pulse Rate:  [86-135] 86 (01/30 0400) Resp:  [12-27] 17 (01/30 0400) BP: (119-148)/(69-94) 119/78 (01/30 0400) SpO2:  [96 %-99 %] 99 % (01/30 0400) Weight:  [138 lb 14.2 oz (63 kg)] 138 lb 14.2 oz (63 kg) (01/29 1145) Physical Exam: General: well nourished, well developed, in no acute distress with non-toxic appearance HEENT: normocephalic, atraumatic, moist mucous membranes CV: regular rate and rhythm without murmurs, rubs, or gallops Lungs: clear to auscultation bilaterally with normal work of breathing Abdomen: soft, non-tender at RUQ without rebound or guarding, normoactive bowel sounds Skin: warm, dry, no rashes or lesions, cap refill < 2 seconds Extremities: warm and well perfused, normal tone  Laboratory:  Recent Labs Lab 04/27/16 2120 04/29/16 0250 04/30/16 0411  WBC 14.2* 8.6 8.0  HGB 15.4 12.5* 12.6*  HCT 45.8 37.3* 39.4  PLT 297 289 373    Recent Labs Lab 04/29/16 0250 04/29/16 0946 04/30/16 0411  NA 138 138 140  K 3.6 3.7 4.1  CL 107 105 110  CO2 21* 17* 19*  BUN 6 7 12   CREATININE 0.82 0.96 0.91  CALCIUM 8.2* 8.4* 8.5*  PROT 5.9* 6.1* 6.3*  BILITOT 7.1* 6.7* 3.6*  ALKPHOS 236* 244* 215*  ALT 242* 233* 174*  AST 104* 91* 43*  GLUCOSE 62* 52* 114*   Urinalysis    Component Value Date/Time   COLORURINE AMBER (A) 04/28/2016 0345   APPEARANCEUR HAZY (A) 04/28/2016 0345   LABSPEC 1.016 04/28/2016 0345   PHURINE 6.0 04/28/2016 0345   GLUCOSEU NEGATIVE 04/28/2016 0345   HGBUR MODERATE (A) 04/28/2016 0345  BILIRUBINUR NEGATIVE 04/28/2016 0345   BILIRUBINUR LARGE 04/26/2016 1210   KETONESUR 5 (A) 04/28/2016 0345   PROTEINUR NEGATIVE 04/28/2016 0345   UROBILINOGEN 1.0  04/26/2016 1210   NITRITE POSITIVE (A) 04/28/2016 0345   LEUKOCYTESUR LARGE (A) 04/28/2016 0345   Urine culture: >100,000 GNB Blood culture: NGTD Hepatic panel: Negative Lipase: 56  Imaging/Diagnostic Tests: ERCP (04/30/2016) - Choledocholithiasis was found. Complete removal was accomplished by balloon extraction. - The biliary tree was swept.  US Abdomen Limited (04/28/2016) FINDINGS: Gallbladder: Multiple echogenic stones present within the gallbladder lumen, largest of which measured approximately 1 cm. Gallbladder wall measure within normal limits at 2 mm. Irregular fluid density present within knee liver adjacent to the gallbladder fossa, better seen on recent CT. No sonographic Murphy sign elicited on exam.  Common bile duct: Diameter: 12 mm. No definite hyperechoic stone seen within the dilated common bile duct.  Liver: Irregular fluid density within the liver adjacent to the gallbladder fossa, better seen on prior CT. Hepatic parenchyma somewhat heterogeneous.  IMPRESSION: 1. Cholelithiasis with dilatation of the common bile duct up to 12 mm. 2. Fluid opacity within the hepatic parenchyma adjacent to the gallbladder fossa, better seen on recent CT. 3. Heterogeneous echotexture of the liver.  CT Abdomen Pelvis W Contrast (04/28/2016) FINDINGS: Lower chest: Small right lung base subpleural density most likely represents atelectatic changes. Infiltrate is less likely but not entirely excluded. Clinical correlation is recommended. Left lung base atelectatic changes noted.  No intra-abdominal free air or free fluid.  Hepatobiliary: The gallbladder is distended. No calcified stone identified. There is noncalcified stone versus sludge within the gallbladder lumen. Noncalcified debris suspected within the midportion of the common bile duct as well as within the central CBD at the head of the pancreas adjacent to the ampulla. There is dilatation of the CBD measuring up to 16 mm.  There is also dilatation of the intrahepatic biliary ducts. There is thickened appearance of the gallbladder wall. There is a 4.0 x 1.0 cm inflammatory collection adjacent to the gallbladder in the subhepatic area concerning for an abscess. The liver is unremarkable.  Pancreas: Unremarkable. No pancreatic ductal dilatation or surrounding inflammatory changes.  Spleen: Normal in size without focal abnormality.  Adrenals/Urinary Tract: The adrenal glands appear unremarkable. The right kidney is atrophic. There is chronic mild dilatation of the right renal collecting system and right ureter. 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 There is mild left hydronephrosis. No obstructing stone identified. Correlation with urinalysis recommended to exclude acute UTI. The urinary bladder is unremarkable.  Stomach/Bowel: There is no evidence of bowel obstruction or active inflammation. Normal appendix.  Vascular/Lymphatic: The abdominal aorta and IVC appear unremarkable. The SMV, splenic vein, and main portal vein are patent. No portal venous gas identified. There is no adenopathy.  Reproductive: The prostate and seminal vesicles are grossly unremarkable.  Other: Right anterior chest wall VP shunt terminates in the subcutaneous soft tissues of the anterior abdominal wall. Right anterior pelvic wall scar. No fluid collection.  Musculoskeletal: Chronic dislocation of the hips. Non fusion of the lumbar posterior elements compatible with spina bifida. There is protrusion and exposure of the CSF field thecal sac compatible with a meningocele. No acute fracture. Postsurgical changes of meningocele repair in the lower back.  IMPRESSION: Dilatation of the intrahepatic and extrahepatic biliary tree as well as distended and inflamed gallbladder likely related to obstruction of  the CBD with findings  of cholecystitis and a small pericholecystic abscess. Associated acute cholangitis is not entirely excluded. Correlation with clinical exam and further evaluation with MRCP recommended to better evaluate for the gallbladder and CBD contents.  Mild left hydronephrosis. Right renal atrophy with chronic appearing dilatation of the right renal collecting system and ureter. Correlation with urinalysis recommended to exclude acute UTI.  Spina bifida and meningocele.  Chronic bilateral dislocated hips.  DG Chest 2 View (04/27/2016) FINDINGS: Right-sided ventriculoperitoneal shunt with irregularity and discontinuity of the tubing over the right flank. Lungs are somewhat hypoinflated with mild prominence of the perihilar markings. No focal airspace consolidation or effusion. Cardiomediastinal silhouette is within normal. Mild gastric distension.  IMPRESSION: Minimal prominence of the perihilar markings which may be due to the degree of hypoinflation, although cannot exclude viral bronchopneumonia or minimal vascular congestion.  Right-sided ventriculoperitoneal shunt with focal irregularity/ discontinuity of the shunt over the lateral thorax.    Wendee Beavers, DO 04/30/2016, 7:04 AM PGY-1, [facility] FPTS Intern pager: [phone], text pages welcome

## 2016-04-30 NOTE — Op Note (Signed)
Hector Shea [id number] 07/02/1990 04/30/2016  Laparoscopic Cholecystectomy Procedure Note  Indications: This patient presents with symptomatic gallbladder disease and will undergo laparoscopic cholecystectomy. Patient underwent ERCP the day before and had multiple stones swept from his bile duct. Please see the chart regarding my discussion regarding risk and benefits of surgery  Pre-operative Diagnosis: Calculus of gallbladder with acute cholecystitis, without mention of obstruction  Post-operative Diagnosis: Same  Surgeon: Atilano InaWILSON,Dinesh Ulysse M   Assistants: Marcille BlancoMatt Tsuei, MD FACS; Gray BernhardtJamie Sipsis RNFA  Anesthesia: General endotracheal anesthesia  Procedure Details  The patient was seen again in the Holding Room. The risks, benefits, complications, treatment options, and expected outcomes were discussed with the patient. The possibilities of reaction to medication, pulmonary aspiration, perforation of viscus, bleeding, recurrent infection, finding a normal gallbladder, the need for additional procedures, failure to diagnose a condition, the possible need to convert to an open procedure, and creating a complication requiring transfusion or operation were discussed with the patient. The likelihood of improving the patient's symptoms with return to their baseline status is good.  The patient and/or family concurred with the proposed plan, giving informed consent. The site of surgery properly noted. The patient was taken to Operating Room, identified as Hector Shea and the procedure verified as Laparoscopic Cholecystectomy. A Time Out was held and the above information confirmed. Antibiotic prophylaxis was administered.   Prior to the induction of general anesthesia, antibiotic prophylaxis was administered. General endotracheal anesthesia was then administered and tolerated well. After the induction, the abdomen was prepped with Chloraprep and draped in the sterile fashion. The patient  was positioned in the supine position.  Local anesthetic agent was injected into the skin near the umbilicus and an incision made. We dissected down to the abdominal fascia with blunt dissection.  The fascia was incised vertically and we entered the peritoneal cavity bluntly.  A pursestring suture of 0-Vicryl was placed around the fascial opening.  The Hasson cannula was inserted and secured with the stay suture.  Pneumoperitoneum was then created with CO2 and tolerated well without any adverse changes in the patient's vital signs. An 5-mm port was placed in the subxiphoid position.  Two 5-mm ports were placed in the right upper quadrant taking care to avoid the subcutaneous VP shunt tubing. All skin incisions were infiltrated with a local anesthetic agent before making the incision and placing the trocars.   We positioned the patient in reverse Trendelenburg, tilted slightly to the patient's left.  The gallbladder was identified, the fundus grasped and retracted cephalad. Adhesions were lysed bluntly and with the electrocautery where indicated, taking care not to injure any adjacent organs or viscus. The gallbladder was thickened and edematous. The omentum was peeled off the gallbladder using a combination of blunt dissection as well as electrocautery. The infundibulum was grasped and retracted laterally, exposing the peritoneum overlying the triangle of Calot. This was then divided and exposed in a blunt fashion. There was dense inflammation around the infundibulum. Tedious dissection was commenced with the suction irrigator catheter along with the [facility] dissector staying close to the infundibulum. A large critical view of the cystic duct and cystic artery was eventually obtained.  The cystic duct was clearly identified and bluntly dissected circumferentially.   The cystic duct was then ligated with clips and divided. The cystic artery which had been identified & dissected free was ligated with clips and  divided as well.   The gallbladder was dissected from the liver bed in retrograde fashion with  the electrocautery. The posterior wall of the gallbladder was fused to the liver surface. The gallbladder was entered.Purulent fluid drained out. There was multiple gallstones that spilled. The entire anterior wall the gallbladder was removed. This left the posterior wall fused to the liver. I ended up changing the subxiphoid trocar to a 12 mm trocar. The gallbladder was removed and placed in an Ecco sac along with multiple spilled gallstones.  The gallbladder and Ecco sac were then removed through the umbilical port site. The liver bed was irrigated and inspected. All remaining gallstones were extracted. Hemostasis was achieved with the electrocautery. Copious irrigation was utilized and was repeatedly aspirated until clear.  I did elect to leave a surgical drain. A round drain was placed and brought out through the right upper quadrant lateral trocar site and secured to the skin with a 2-0 nylon. The end of the drain was placed in the gallbladder fossa. The pursestring suture was used to close the umbilical fascia.  There is a small air leak at the umbilical fascia and a additional interrupted 0 Vicryl suture was placed using the PMI suture passer  We again inspected the right upper quadrant for hemostasis.  The umbilical closure was inspected and there was no air leak and nothing trapped within the closure. I removed the subxiphoid 12 mm trocar and closed the fascia with an interrupted 0 Vicryl suture using the PMI suture passer. Pneumoperitoneum was released as we removed the trocars.  4-0 Monocryl was used to close the skin.   Benzoin, steri-strips, and clean dressings were applied. The patient was then extubated and brought to the recovery room in stable condition. Instrument, sponge, and needle counts were correct at closure and at the conclusion of the case.   Findings: Cholecystitis with  Cholelithiasis  Estimated Blood Loss: less than 50 mL         Drains: round blake drain         Specimens: Gallbladder           Complications: None; patient tolerated the procedure well.         Disposition: PACU - hemodynamically stable.         Condition: stable  Mary Sella. Andrey Campanile, MD, FACS General, Bariatric, & Minimally Invasive Surgery [facility], [place]

## 2016-04-30 NOTE — Anesthesia Preprocedure Evaluation (Signed)
Anesthesia Evaluation  Patient identified by MRN, date of birth, ID band Patient awake    Reviewed: Allergy & Precautions, H&P , NPO status , Patient's Chart, lab work & pertinent test results  Airway Mallampati: II   Neck ROM: full    Dental   Pulmonary neg pulmonary ROS,    breath sounds clear to auscultation       Cardiovascular negative cardio ROS   Rhythm:regular Rate:Normal     Neuro/Psych Spina bifida. Paraparesis.    GI/Hepatic   Endo/Other    Renal/GU      Musculoskeletal   Abdominal   Peds  Hematology   Anesthesia Other Findings   Reproductive/Obstetrics                             Anesthesia Physical Anesthesia Plan  ASA: II  Anesthesia Plan: General   Post-op Pain Management:    Induction: Intravenous  Airway Management Planned: Oral ETT  Additional Equipment:   Intra-op Plan:   Post-operative Plan: Extubation in OR  Informed Consent: I have reviewed the patients History and Physical, chart, labs and discussed the procedure including the risks, benefits and alternatives for the proposed anesthesia with the patient or authorized representative who has indicated his/her understanding and acceptance.     Plan Discussed with: CRNA, Anesthesiologist and Surgeon  Anesthesia Plan Comments:         Anesthesia Quick Evaluation

## 2016-04-30 NOTE — Anesthesia Postprocedure Evaluation (Signed)
Anesthesia Post Note  Patient: Hector Shea  Procedure(s) Performed: Procedure(s) (LRB): LAPAROSCOPIC CHOLECYSTECTOMY (N/A)  Patient location during evaluation: PACU Anesthesia Type: General Level of consciousness: awake and alert Pain management: pain level controlled Vital Signs Assessment: post-procedure vital signs reviewed and stable Respiratory status: spontaneous breathing, nonlabored ventilation, respiratory function stable and patient connected to nasal cannula oxygen Cardiovascular status: blood pressure returned to baseline and stable Postop Assessment: no signs of nausea or vomiting Anesthetic complications: no       Last Vitals:  Vitals:   04/30/16 1423 04/30/16 1447  BP: (!) 146/87 136/88  Pulse: 95 70  Resp: 19 19  Temp: 36.2 C 36.6 C    Last Pain:  Vitals:   04/30/16 1447  TempSrc: Oral  PainSc:                  Erendida Wrenn DAVID

## 2016-04-30 NOTE — Progress Notes (Signed)
Subjective: No complaints.  Objective: Vital signs in last 24 hours: Temp:  [97.7 F (36.5 C)-98.9 F (37.2 C)] 97.7 F (36.5 C) (01/30 0400) Pulse Rate:  [86-135] 86 (01/30 0400) Resp:  [12-27] 17 (01/30 0400) BP: (119-148)/(69-94) 119/78 (01/30 0400) SpO2:  [96 %-99 %] 99 % (01/30 0400) Weight:  [63 kg (138 lb 14.2 oz)] 63 kg (138 lb 14.2 oz) (01/29 1145) Last BM Date:  (PTA)  Intake/Output from previous day: 01/29 0701 - 01/30 0700 In: 2622.5 [I.V.:2522.5; IV Piggyback:100] Out: 905 [Urine:900; Blood:5] Intake/Output this shift: No intake/output data recorded.  General appearance: alert and no distress GI: soft, non-tender; bowel sounds normal; no masses,  no organomegaly  Lab Results:  Recent Labs  04/27/16 2120 04/29/16 0250 04/30/16 0411  WBC 14.2* 8.6 8.0  HGB 15.4 12.5* 12.6*  HCT 45.8 37.3* 39.4  PLT 297 289 373   BMET  Recent Labs  04/29/16 0250 04/29/16 0946 04/30/16 0411  NA 138 138 140  K 3.6 3.7 4.1  CL 107 105 110  CO2 21* 17* 19*  GLUCOSE 62* 52* 114*  BUN 6 7 12   CREATININE 0.82 0.96 0.91  CALCIUM 8.2* 8.4* 8.5*   LFT  Recent Labs  04/28/16 0103  04/30/16 0411  PROT 8.2*  < > 6.3*  ALBUMIN 3.5  < > 2.4*  AST 257*  < > 43*  ALT 419*  < > 174*  ALKPHOS 369*  < > 215*  BILITOT 10.3*  < > 3.6*  BILIDIR 6.4*  --   --   IBILI 3.9*  --   --   < > = values in this interval not displayed. PT/INR No results for input(s): LABPROT, INR in the last 72 hours. Hepatitis Panel No results for input(s): HEPBSAG, HCVAB, HEPAIGM, HEPBIGM in the last 72 hours. C-Diff No results for input(s): CDIFFTOX in the last 72 hours. Fecal Lactopherrin No results for input(s): FECLLACTOFRN in the last 72 hours.  Studies/Results: Dg Ercp  Result Date: 04/29/2016 CLINICAL DATA:  Biliary dilatation, cholelithiasis EXAM: ERCP with balloon sweep for stone removal TECHNIQUE: Multiple spot images obtained with the fluoroscopic device and submitted for  interpretation post-procedure. FLUOROSCOPY TIME:  Fluoroscopy Time:  3 minutes 18 seconds COMPARISON:  04/28/2016 FINDINGS: Six spot fluoroscopic images performed during the ERCP and intervention. Retrograde cholangiogram demonstrates numerous irregular small filling defects within the common bile duct compatible with choledocholithiasis. Mild biliary dilatation. Balloon sweep for stone removal performed. IMPRESSION: Limited imaging during ERCP for choledocholithiasis with balloon sweep for stone removal. These images were submitted for radiologic interpretation only. Please see the procedural report for the amount of contrast and the fluoroscopy time utilized. Electronically Signed   By: Judie PetitM.  Shick M.D.   On: 04/29/2016 14:18    Medications:  Scheduled: . heparin  5,000 Units Subcutaneous Q8H  . piperacillin-tazobactam (ZOSYN)  IV  3.375 g Intravenous Q8H  . sodium chloride flush  3 mL Intravenous Q12H   Continuous: . sodium chloride 150 mL/hr at 04/29/16 2323    Assessment/Plan: 1) Choledocholithiasis. 2) Cholelithiasis.   No post-ERCP pancreatitis.  He seems to be a bit confused and upset this morning.  I think he thought that the ERCP was the only procedure necessary.  I tried to explain to him the need for both ERCP and lap chole.  He states that he understands, but I have my doubts.  Plan: 1) Lap chole per Surgery. 2) Signing off.  LOS: 2 days   Mirian Casco D 04/30/2016,  7:07 AM

## 2016-05-01 ENCOUNTER — Encounter (HOSPITAL_COMMUNITY): Payer: Self-pay | Admitting: General Surgery

## 2016-05-01 ENCOUNTER — Ambulatory Visit (HOSPITAL_COMMUNITY): Payer: Medicare Other

## 2016-05-01 ENCOUNTER — Inpatient Hospital Stay (HOSPITAL_COMMUNITY): Payer: Medicare Other

## 2016-05-01 DIAGNOSIS — R17 Unspecified jaundice: Secondary | ICD-10-CM

## 2016-05-01 LAB — CBC
HEMATOCRIT: 38.4 % — AB (ref 39.0–52.0)
HEMOGLOBIN: 12.5 g/dL — AB (ref 13.0–17.0)
MCH: 29.4 pg (ref 26.0–34.0)
MCHC: 32.6 g/dL (ref 30.0–36.0)
MCV: 90.4 fL (ref 78.0–100.0)
Platelets: 394 10*3/uL (ref 150–400)
RBC: 4.25 MIL/uL (ref 4.22–5.81)
RDW: 15.5 % (ref 11.5–15.5)
WBC: 14 10*3/uL — ABNORMAL HIGH (ref 4.0–10.5)

## 2016-05-01 LAB — COMPREHENSIVE METABOLIC PANEL
ALBUMIN: 2.2 g/dL — AB (ref 3.5–5.0)
ALK PHOS: 165 U/L — AB (ref 38–126)
ALT: 131 U/L — ABNORMAL HIGH (ref 17–63)
ANION GAP: 10 (ref 5–15)
AST: 53 U/L — ABNORMAL HIGH (ref 15–41)
BILIRUBIN TOTAL: 3 mg/dL — AB (ref 0.3–1.2)
BUN: 11 mg/dL (ref 6–20)
CALCIUM: 7.7 mg/dL — AB (ref 8.9–10.3)
CO2: 20 mmol/L — ABNORMAL LOW (ref 22–32)
Chloride: 106 mmol/L (ref 101–111)
Creatinine, Ser: 0.89 mg/dL (ref 0.61–1.24)
GFR calc Af Amer: >60 mL/min (ref >60)
GFR calc non Af Amer: >60 mL/min (ref >60)
GLUCOSE: 95 mg/dL (ref 65–99)
Potassium: 3.2 mmol/L — ABNORMAL LOW (ref 3.5–5.1)
Sodium: 136 mmol/L (ref 135–145)
TOTAL PROTEIN: 5.8 g/dL — AB (ref 6.5–8.1)

## 2016-05-01 MED ORDER — PIPERACILLIN-TAZOBACTAM 3.375 G IVPB
3.3750 g | Freq: Three times a day (TID) | INTRAVENOUS | Status: AC
  Administered 2016-05-01 – 2016-05-03 (×6): 3.375 g via INTRAVENOUS
  Filled 2016-05-01 (×6): qty 50

## 2016-05-01 NOTE — Progress Notes (Signed)
Family Medicine Teaching Service Daily Progress Note Intern Pager: [phone]  Patient name: Hector Shea Medical record number: [id number] Date of birth: [birth year] Age: 26 y.o. Gender: male  Primary Care Provider: [name], MD Consultants: Gastroenterology, general surgery Code Status: Full  Pt Overview and Major Events to Date:  01/28: Admit for RUQ pain concerning for acute cholangitis 01/29: ERCP for ascending cholangitis 01/30: OR for lap-chole  Assessment and Plan: Hector Shea is a 26 y.o. male presenting with worsening abdominal pain. PMH is significant for spina bifida with hydrocephalus, ventricular shunt, and neurogenic bladder with incontinence.  #Ascending cholangitis; POD1 s/p lap-chole: Acute, improved. Upper quadrant pain. S/p ERCP on 1/29 with removal of many stones. Patient to underwent laparoscopic cholecystectomy on 1/31. Patient appears diaphoretic and tachycardic without increased work of breathing or fevers this morning. Has new onset leukocytosis this morning. Currently on Zosyn. Concerning for postop complications. Surgery to see patient this morning. Liver enzymes continue to improve. --Gastroenterology consulted, appreciate recommendations, signed off --General surgery consulted, appreciate recommendations --Percutaneous decompression may be necessary --IV Zosyn per GI recommendations (day 4, 1/28>) --CMET daily --Blood cultures NGTD --NPO --Fentanyl as needed for pain (avoid morphine due to effects on Sphincter of Oddi)  #Tachycardia: Acute, Resolved. Presented 120s to 150s. Likely secondary to pain. --Control pain; fentanyl every 2 hours as needed; OxyIR 5 mg q4h PRN --IV NS at 150 mL/hour  #Neurogenic bladder with incontinence: 2/2 spina bifida. In and out cath at home --Foley due to anticipation for possible procedures  FEN/GI: NS IV @150ml /hr; NPO Prophylaxis: Heparin SQ  Disposition: Pending cholecystectomy s/p  ERCP.  Subjective:  [employer] interpre[employer] used: Blossom Hoops [phone]. Patient says he has been sprinting pain near the sites of incisions from surgery yesterday. Says pain medication has helped and just received some this morning. Denies nausea, shortness of breath, chest pain.  Objective: Temp:  [97.2 F (36.2 C)-99 F (37.2 C)] 98.5 F (36.9 C) (01/31 0330) Pulse Rate:  [62-110] 110 (01/31 0330) Resp:  [14-32] 32 (01/31 0330) BP: (116-149)/(79-89) 137/82 (01/31 0330) SpO2:  [94 %-100 %] 95 % (01/31 0330) Physical Exam: General: well nourished, well developed, diaphoretic with minimal distress with pain near incision sites with non-toxic appearance HEENT: normocephalic, atraumatic, moist mucous membranes CV: tachycardic with regular rhythm without murmurs, rubs, or gallops Lungs: clear to auscultation bilaterally with normal work of breathing Abdomen: soft, minimally tender near sites of incision without rebound or guarding, normoactive bowel sounds, dressings dry without surrounding erythema or edema Skin: warm, dry, no rashes or lesions, cap refill < 2 seconds Extremities: warm and well perfused, normal tone   Laboratory:  Recent Labs Lab 04/29/16 0250 04/30/16 0411 05/01/16 0337  WBC 8.6 8.0 14.0*  HGB 12.5* 12.6* 12.5*  HCT 37.3* 39.4 38.4*  PLT 289 373 394    Recent Labs Lab 04/29/16 0946 04/30/16 0411 05/01/16 0337  NA 138 140 136  K 3.7 4.1 3.2*  CL 105 110 106  CO2 17* 19* 20*  BUN 7 12 11   CREATININE 0.96 0.91 0.89  CALCIUM 8.4* 8.5* 7.7*  PROT 6.1* 6.3* 5.8*  BILITOT 6.7* 3.6* 3.0*  ALKPHOS 244* 215* 165*  ALT 233* 174* 131*  AST 91* 43* 53*  GLUCOSE 52* 114* 95   Urinalysis    Component Value Date/Time   COLORURINE AMBER (A) 04/28/2016 0345   APPEARANCEUR HAZY (A) 04/28/2016 0345   LABSPEC 1.016 04/28/2016 0345   PHURINE 6.0 04/28/2016 0345   GLUCOSEU  NEGATIVE 04/28/2016 0345   HGBUR MODERATE (A) 04/28/2016 0345   BILIRUBINUR NEGATIVE 04/28/2016  0345   BILIRUBINUR LARGE 04/26/2016 1210   KETONESUR 5 (A) 04/28/2016 0345   PROTEINUR NEGATIVE 04/28/2016 0345   UROBILINOGEN 1.0 04/26/2016 1210   NITRITE POSITIVE (A) 04/28/2016 0345   LEUKOCYTESUR LARGE (A) 04/28/2016 0345   Urine culture: >100,000 GNB Blood culture: NGTD Hepatic panel: Negative Lipase: 56  Imaging/Diagnostic Tests: ERCP (04/30/2016) - Choledocholithiasis was found. Complete removal was accomplished by balloon extraction. - The biliary tree was swept.  US Abdomen Limited (04/28/2016) FINDINGS: Gallbladder: Multiple echogenic stones present within the gallbladder lumen, largest of which measured approximately 1 cm. Gallbladder wall measure within normal limits at 2 mm. Irregular fluid density present within knee liver adjacent to the gallbladder fossa, better seen on recent CT. No sonographic Murphy sign elicited on exam.  Common bile duct: Diameter: 12 mm. No definite hyperechoic stone seen within the dilated common bile duct.  Liver: Irregular fluid density within the liver adjacent to the gallbladder fossa, better seen on prior CT. Hepatic parenchyma somewhat heterogeneous.  IMPRESSION: 1. Cholelithiasis with dilatation of the common bile duct up to 12 mm. 2. Fluid opacity within the hepatic parenchyma adjacent to the gallbladder fossa, better seen on recent CT. 3. Heterogeneous echotexture of the liver.  CT Abdomen Pelvis W Contrast (04/28/2016) FINDINGS: Lower chest: Small right lung base subpleural density most likely represents atelectatic changes. Infiltrate is less likely but not entirely excluded. Clinical correlation is recommended. Left lung base atelectatic changes noted.  No intra-abdominal free air or free fluid.  Hepatobiliary: The gallbladder is distended. No calcified stone identified. There is noncalcified stone versus sludge within the gallbladder lumen. Noncalcified debris suspected within the midportion of the common bile duct as  well as within the central CBD at the head of the pancreas adjacent to the ampulla. There is dilatation of the CBD measuring up to 16 mm. There is also dilatation of the intrahepatic biliary ducts. There is thickened appearance of the gallbladder wall. There is a 4.0 x 1.0 cm inflammatory collection adjacent to the gallbladder in the subhepatic area concerning for an abscess. The liver is unremarkable.  Pancreas: Unremarkable. No pancreatic ductal dilatation or surrounding inflammatory changes.  Spleen: Normal in size without focal abnormality.  Adrenals/Urinary Tract: The adrenal glands appear unremarkable. The right kidney is atrophic. There is chronic mild dilatation of the right renal collecting system and right ureter. 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 There is mild left hydronephrosis. No obstructing stone identified. Correlation with urinalysis recommended to exclude acute UTI. The urinary bladder is unremarkable.  Stomach/Bowel: There is no evidence of bowel obstruction or active inflammation. Normal appendix.  Vascular/Lymphatic: The abdominal aorta and IVC appear unremarkable. The SMV, splenic vein, and main portal vein are patent. No portal venous gas identified. There is no adenopathy.  Reproductive: The prostate and seminal vesicles are grossly unremarkable.  Other: Right anterior chest wall VP shunt terminates in the subcutaneous soft tissues of the anterior abdominal wall. Right anterior pelvic wall scar. No fluid collection.  Musculoskeletal: Chronic dislocation of the hips. Non fusion of the lumbar posterior elements compatible with spina bifida. There is protrusion and exposure of the CSF field thecal sac compatible with a meningocele. No acute fracture. Postsurgical changes of meningocele repair in the lower back.  IMPRESSION: Dilatation of the  intrahepatic and extrahepatic biliary  tree as well as distended and inflamed gallbladder likely related to obstruction of the CBD with findings of cholecystitis and a small pericholecystic abscess. Associated acute cholangitis is not entirely excluded. Correlation with clinical exam and further evaluation with MRCP recommended to better evaluate for the gallbladder and CBD contents.  Mild left hydronephrosis. Right renal atrophy with chronic appearing dilatation of the right renal collecting system and ureter. Correlation with urinalysis recommended to exclude acute UTI.  Spina bifida and meningocele.  Chronic bilateral dislocated hips.  DG Chest 2 View (04/27/2016) FINDINGS: Right-sided ventriculoperitoneal shunt with irregularity and discontinuity of the tubing over the right flank. Lungs are somewhat hypoinflated with mild prominence of the perihilar markings. No focal airspace consolidation or effusion. Cardiomediastinal silhouette is within normal. Mild gastric distension.  IMPRESSION: Minimal prominence of the perihilar markings which may be due to the degree of hypoinflation, although cannot exclude viral bronchopneumonia or minimal vascular congestion.  Right-sided ventriculoperitoneal shunt with focal irregularity/ discontinuity of the shunt over the lateral thorax.    Wendee Beaversavid J McMullen, DO 05/01/2016, 7:11 AM PGY-1, [facility] FPTS Intern pager: [phone], text pages welcome

## 2016-05-01 NOTE — Progress Notes (Signed)
[facility] Progress Note  1 Day Post-Op  Subjective: Pt is tolerating his diet and states no abdominal pain. No nausea or vomiting. He is having flatus. He would like to go home today. He would like a flu shot. Family at bedside.   Objective: Vital signs in last 24 hours: Temp:  [97.2 F (36.2 C)-99.9 F (37.7 C)] 98.4 F (36.9 C) (01/31 0816) Pulse Rate:  [62-110] 107 (01/31 0700) Resp:  [14-32] 20 (01/31 0700) BP: (116-149)/(79-89) 131/80 (01/31 0700) SpO2:  [94 %-100 %] 95 % (01/31 0700) Last BM Date:  (PTA)  Intake/Output from previous day: 01/30 0701 - 01/31 0700 In: 4643.8 [I.V.:4443.8; IV Piggyback:200] Out: 1355 [Urine:1100; Drains:215; Blood:40] Intake/Output this shift: No intake/output data recorded.  PE: Gen:  Alert, NAD, pleasant, cooperative, sitting up in bed, well appearing, eating breakfast Card:  RRR, no M/G/R heard Pulm:  Rate and effort normal Abd: Soft, nondisteded, +BS, incisions C/D/I, drain with minimal sanguinous drainage, moderate generalized TTP. Skin: not diaphoretic, warm and dry  Lab Results:   Recent Labs  04/30/16 0411 05/01/16 0337  WBC 8.0 14.0*  HGB 12.6* 12.5*  HCT 39.4 38.4*  PLT 373 394   BMET  Recent Labs  04/30/16 0411 05/01/16 0337  NA 140 136  K 4.1 3.2*  CL 110 106  CO2 19* 20*  GLUCOSE 114* 95  BUN 12 11  CREATININE 0.91 0.89  CALCIUM 8.5* 7.7*   PT/INR No results for input(s): LABPROT, INR in the last 72 hours. CMP     Component Value Date/Time   NA 136 05/01/2016 0337   K 3.2 (L) 05/01/2016 0337   CL 106 05/01/2016 0337   CO2 20 (L) 05/01/2016 0337   GLUCOSE 95 05/01/2016 0337   BUN 11 05/01/2016 0337   CREATININE 0.89 05/01/2016 0337   CREATININE 0.76 04/26/2016 1130   CALCIUM 7.7 (L) 05/01/2016 0337   PROT 5.8 (L) 05/01/2016 0337   ALBUMIN 2.2 (L) 05/01/2016 0337   AST 53 (H) 05/01/2016 0337   ALT 131 (H) 05/01/2016 0337   ALKPHOS 165 (H) 05/01/2016 0337   BILITOT 3.0 (H)  05/01/2016 0337   GFRNONAA >60 05/01/2016 0337   GFRNONAA >89 04/26/2016 1130   GFRAA >60 05/01/2016 0337   GFRAA >89 04/26/2016 1130   Lipase     Component Value Date/Time   LIPASE 40 04/30/2016 0411       Studies/Results: Dg Ercp  Result Date: 04/29/2016 CLINICAL DATA:  Biliary dilatation, cholelithiasis EXAM: ERCP with balloon sweep for stone removal TECHNIQUE: Multiple spot images obtained with the fluoroscopic device and submitted for interpretation post-procedure. FLUOROSCOPY TIME:  Fluoroscopy Time:  3 minutes 18 seconds COMPARISON:  04/28/2016 FINDINGS: Six spot fluoroscopic images performed during the ERCP and intervention. Retrograde cholangiogram demonstrates numerous irregular small filling defects within the common bile duct compatible with choledocholithiasis. Mild biliary dilatation. Balloon sweep for stone removal performed. IMPRESSION: Limited imaging during ERCP for choledocholithiasis with balloon sweep for stone removal. These images were submitted for radiologic interpretation only. Please see the procedural report for the amount of contrast and the fluoroscopy time utilized. Electronically Signed   By: Judie Petit.  Shick M.D.   On: 04/29/2016 14:18    Anti-infectives: Anti-infectives    Start     Dose/Rate Route Frequency Ordered Stop   04/30/16 1830  piperacillin-tazobactam (ZOSYN) IVPB 3.375 g     3.375 g 12.5 mL/hr over 240 Minutes Intravenous Every 8 hours 04/30/16 1457 05/01/16 0604   04/28/16 1400  piperacillin-tazobactam (ZOSYN) IVPB 3.375 g  Status:  Discontinued     3.375 g 100 mL/hr over 30 Minutes Intravenous Every 8 hours 04/28/16 1006 04/28/16 1007   04/28/16 1030  piperacillin-tazobactam (ZOSYN) IVPB 3.375 g  Status:  Discontinued     3.375 g 12.5 mL/hr over 240 Minutes Intravenous Every 8 hours 04/28/16 1008 04/30/16 1457   04/28/16 0730  ciprofloxacin (CIPRO) IVPB 400 mg  Status:  Discontinued     400 mg 200 mL/hr over 60 Minutes Intravenous Every 12  hours 04/28/16 0657 04/28/16 1006   04/28/16 0215  vancomycin (VANCOCIN) IVPB 1000 mg/200 mL premix     1,000 mg 200 mL/hr over 60 Minutes Intravenous  Once 04/28/16 0211 04/28/16 0451   04/28/16 0215  piperacillin-tazobactam (ZOSYN) IVPB 3.375 g     3.375 g 100 mL/hr over 30 Minutes Intravenous  Once 04/28/16 0211 04/28/16 0317       Assessment/Plan  Spina bifida with hydrocephalus 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 Tachycardia  Ascending cholangitis, cholecystitis with small pericholecystic abscess S/P Cholecystectomy, 04/30/16, Dr. Andrey CampanileWilson POD#1 - LFT's and bilirubin trending down - GI did ERCP prior to surgery  - advance diet as tolerated, continue IV abx, can transition to PO at discharge - Drain will need to remain for 3-4 days, if discharged soon will need to f/u in our clinic to have removed - f/u in our clinic in 2-3 weeks for postop f/u.  - AM labs  - pt requesting flu shot, will defer to primary   LOS: 3 days    Jerre SimonJessica L Sheily Lineman , [facility] 05/01/2016, 9:21 AM Pager: [phone] Consults: [phone] Mon-Fri 7:00 am-4:30 pm Sat-Sun 7:00 am-11:30 am

## 2016-05-01 NOTE — Progress Notes (Signed)
FPTS Interim Progress Note  S: Went to see patient. Patient was febrile to 100.7, tachypnic & tachycardic. When I arrived, patient was doing self cath. Heart rate was 120s to 130s on telemetry. He doesn't appear to be in any distress. However, he reports problem with his breathing. He reports increased secretion in his mouth. He speaks in full sentence. He denies chest pain or abdominal pain. Denies pain with deep breathing. He reports feeling palpitation when he has problem with breathing. He never had history of this. He denies pain or swelling in his legs.   O:  BP (!) 151/85 (BP Location: Left Arm)   Pulse (!) 133   Temp 99.7 F (37.6 C) (Oral)   Resp (!) 34   Ht 5' (1.524 m)   Wt 138 lb 14.2 oz (63 kg)   SpO2 93%   BMI 27.13 kg/m     A/P: GEN: appears well, no apparent distress. HEM: negative for cervical or periauricular lymphadenopathies CVS: tahchycardic, RR, nl s1 & s2, no murmurs, no edema RESP: speaks in full sentence, no IWOB, poor air movement bilaterally, CTAB anteriorly and laterally GI: JP drain in place with serosanguineous fluid MSK: Muscular wasting in his legs, no calf tenderness or swelling SKIN: no apparent skin lesion NEURO: alert and oiented appropriately, no gross defecits  PSYCH: euthymic mood with congruent affect  Assessment/plan Patient was tachycardia and tachycardia on post op day #1. Appears well on exam. Differential diagnosis are atelectasis (poor respiratory effort), UTI but covered on Zosyn, unlikely DVT/PE while on heparin, GI tract infection (recent GI procedure). Chest x-ray read as left basilar airspace opacity raising concern for pneumonia. He is already on Zosyn.  -Give incentive spirometry. He was able to inhale up to 1 L after multiple trials. Recommended 5-6 deep inhalation with incentive spirometry every 20 minutes -Advised to let the nurse know if he is in pain. He currently denies pain -Can consider adding Vanc if no  improvement -Continue IV fluid  Almon Herculesaye T Gonfa, MD 05/01/2016, 9:31 PM PGY-2, [facility] pager [phone]

## 2016-05-01 NOTE — Progress Notes (Signed)
Talked to Dr. Gaynelle AduEric Wilson to clarify about his antibiotics specifically Zosyn. Dr. Andrey CampanileWilson doesn't think he needs to continue to Zosyn given that the gallbladder is out unless he has fever or positive blood culture. He will be rounding on the patient today to assess him.

## 2016-05-01 NOTE — Progress Notes (Signed)
At beginning of shift pt HR between 140's-150's. Pt denies having chest pain. EKG done. Pt now c/o abd pain. Pain medication given. Pt HR now between 118-120's. Dr. Alanda SlimGonfa made aware. Will continue to monitor.

## 2016-05-02 ENCOUNTER — Inpatient Hospital Stay (HOSPITAL_COMMUNITY): Payer: Medicare Other

## 2016-05-02 DIAGNOSIS — R509 Fever, unspecified: Secondary | ICD-10-CM

## 2016-05-02 DIAGNOSIS — R5082 Postprocedural fever: Secondary | ICD-10-CM

## 2016-05-02 LAB — COMPREHENSIVE METABOLIC PANEL
ALBUMIN: 2.1 g/dL — AB (ref 3.5–5.0)
ALT: 91 U/L — ABNORMAL HIGH (ref 17–63)
ANION GAP: 9 (ref 5–15)
AST: 34 U/L (ref 15–41)
Alkaline Phosphatase: 130 U/L — ABNORMAL HIGH (ref 38–126)
BUN: 5 mg/dL — ABNORMAL LOW (ref 6–20)
CHLORIDE: 111 mmol/L (ref 101–111)
CO2: 20 mmol/L — AB (ref 22–32)
Calcium: 7.4 mg/dL — ABNORMAL LOW (ref 8.9–10.3)
Creatinine, Ser: 0.74 mg/dL (ref 0.61–1.24)
GFR calc non Af Amer: >60 mL/min (ref >60)
GLUCOSE: 105 mg/dL — AB (ref 65–99)
POTASSIUM: 2.9 mmol/L — AB (ref 3.5–5.1)
SODIUM: 140 mmol/L (ref 135–145)
Total Bilirubin: 2.2 mg/dL — ABNORMAL HIGH (ref 0.3–1.2)
Total Protein: 5.5 g/dL — ABNORMAL LOW (ref 6.5–8.1)

## 2016-05-02 LAB — CBC
HEMATOCRIT: 37.6 % — AB (ref 39.0–52.0)
HEMOGLOBIN: 11.8 g/dL — AB (ref 13.0–17.0)
MCH: 28.9 pg (ref 26.0–34.0)
MCHC: 31.4 g/dL (ref 30.0–36.0)
MCV: 91.9 fL (ref 78.0–100.0)
Platelets: 349 10*3/uL (ref 150–400)
RBC: 4.09 MIL/uL — AB (ref 4.22–5.81)
RDW: 15.7 % — ABNORMAL HIGH (ref 11.5–15.5)
WBC: 11.3 10*3/uL — ABNORMAL HIGH (ref 4.0–10.5)

## 2016-05-02 LAB — MAGNESIUM: Magnesium: 1.4 mg/dL — ABNORMAL LOW (ref 1.7–2.4)

## 2016-05-02 MED ORDER — MAGNESIUM SULFATE 2 GM/50ML IV SOLN
2.0000 g | Freq: Once | INTRAVENOUS | Status: AC
  Administered 2016-05-02: 2 g via INTRAVENOUS
  Filled 2016-05-02: qty 50

## 2016-05-02 MED ORDER — ACETAMINOPHEN 500 MG PO TABS
1000.0000 mg | ORAL_TABLET | Freq: Four times a day (QID) | ORAL | Status: DC
  Administered 2016-05-02 – 2016-05-04 (×8): 1000 mg via ORAL
  Filled 2016-05-02 (×7): qty 2

## 2016-05-02 MED ORDER — IOPAMIDOL (ISOVUE-370) INJECTION 76%
INTRAVENOUS | Status: AC
  Administered 2016-05-02: 100 mL
  Filled 2016-05-02: qty 100

## 2016-05-02 MED ORDER — POTASSIUM CHLORIDE CRYS ER 20 MEQ PO TBCR
40.0000 meq | EXTENDED_RELEASE_TABLET | Freq: Three times a day (TID) | ORAL | Status: AC
  Administered 2016-05-02 (×3): 40 meq via ORAL
  Filled 2016-05-02 (×3): qty 2

## 2016-05-02 NOTE — Progress Notes (Signed)
Family Medicine Teaching Service Daily Progress Note Intern Pager: [phone]  Patient name: Hector Shea Medical record number: [id number] Date of birth: January 06, 1991 Age: 26 y.o. Gender: male  Primary Care Provider: Uvaldo RisingFLETKE, KYLE, J, MD Consultants: Gastroenterology, general surgery Code Status: Full  Pt Overview and Major Events to Date:  01/28: Admit for RUQ pain concerning for acute cholangitis 01/29: ERCP for ascending cholangitis 01/30: OR for lap-chole 02/01: Spiked fever 101F at MN while on IV Zosyn, BCx drawn, CTA ordered  Assessment and Plan: Hector Shea is a 26 y.o. male presenting with worsening abdominal pain. PMH is significant for spina bifida with hydrocephalus, ventricular shunt, and neurogenic bladder with incontinence.  #Ascending cholangitis; POD1 s/p lap-chole: Acute, improved. Upper quadrant pain. S/p ERCP on 1/29 with removal of many stones. Patient to underwent laparoscopic cholecystectomy on 1/31. Patient appears diaphoretic and tachycardic without increased work of breathing or fevers this morning. Leukocytosis improving. Currently on Zosyn. Liver enzymes continue to improve. --Gastroenterology consulted, appreciate recommendations, signed off --General surgery consulted, appreciate recommendations --IV Zosyn per GI recommendations (day 5, 1/28>) --CMET and CBC daily --Blood cultures NGTD --Repeat UCx pending s/p fever while on IV Zosyn --NPO --OxyIR 5-10 mg q4h PRN --Fentanyl 25-50 mcg q1h PRN --Tylenol 1 g q6h scheduled  #Tachycardia and dyspnea without hypoxia: Acute, persists. HR in 110s. Likely secondary to pain secondary to surgery. However PE could not be ruled out given her recent surgery, bedbound status, new complaint of shortness of breath without hypoxia. Patient spiked fever while on IV Zosyn. Could be secondary to infection not covered by Zosyn, however patient remains afebrile since. UCx repeated and pending. --Control  pain per above --CTA pending --Repeat UCx pending --Will provide 0.5L bolus  #Hypokalemia and hypomagnesemia: Acute, new finding. Repleting as needed. Patient remains NPO. --Replete K as needed --Replete Mg as needed  #Stage II decubitus ulcer: Chronic, stable. No signs of active infection. Will consult wound care for evaluation given tachycardia and recent fever. RN to change dressing. --WOC consult pending  #Neurogenic bladder with incontinence: 2/2 spina bifida. In and out cath at home --Repeat UCx per above  FEN/GI: NS IV @125ml /hr; NPO Prophylaxis: Heparin SQ  Disposition: Pending CTA and improvement of tachycardia and repeat UCx.  Subjective:  Pain improved since yesterday. Denies chest pain, nausea or vomiting, fevers or chills. Has been expressing some shortness of breath and barely able to use incentive spirometry. Breathing high 90s on room air.  Objective: Temp:  [98.4 F (36.9 C)-101 F (38.3 C)] 99 F (37.2 C) (02/01 0300) Pulse Rate:  [91-139] 91 (02/01 0300) Resp:  [19-38] 19 (02/01 0300) BP: (123-151)/(80-106) 123/81 (02/01 0300) SpO2:  [92 %-97 %] 97 % (02/01 0300) Physical Exam: General: well nourished, well developed, diaphoretic with minimal distress with pain near incision sites with non-toxic appearance HEENT: normocephalic, atraumatic, moist mucous membranes CV: tachycardic with regular rhythm without murmurs, rubs, or gallops Lungs: clear to auscultation bilaterally with normal work of breathing Abdomen: soft, minimally tender near sites of incision without rebound or guarding, normoactive bowel sounds, dressings dry without surrounding erythema or edema Skin: warm, dry, no rashes or lesions, cap refill < 2 seconds Extremities: warm and well perfused, normal tone   Laboratory:  Recent Labs Lab 04/30/16 0411 05/01/16 0337 05/02/16 0330  WBC 8.0 14.0* 11.3*  HGB 12.6* 12.5* 11.8*  HCT 39.4 38.4* 37.6*  PLT 373 394 349    Recent Labs Lab  04/30/16 0411 05/01/16 0337 05/02/16 0330  NA 140 136 140  K 4.1 3.2* 2.9*  CL 110 106 111  CO2 19* 20* 20*  BUN 12 11 5*  CREATININE 0.91 0.89 0.74  CALCIUM 8.5* 7.7* 7.4*  PROT 6.3* 5.8* 5.5*  BILITOT 3.6* 3.0* 2.2*  ALKPHOS 215* 165* 130*  ALT 174* 131* 91*  AST 43* 53* 34  GLUCOSE 114* 95 105*   Urinalysis    Component Value Date/Time   COLORURINE AMBER (A) 04/28/2016 0345   APPEARANCEUR HAZY (A) 04/28/2016 0345   LABSPEC 1.016 04/28/2016 0345   PHURINE 6.0 04/28/2016 0345   GLUCOSEU NEGATIVE 04/28/2016 0345   HGBUR MODERATE (A) 04/28/2016 0345   BILIRUBINUR NEGATIVE 04/28/2016 0345   BILIRUBINUR LARGE 04/26/2016 1210   KETONESUR 5 (A) 04/28/2016 0345   PROTEINUR NEGATIVE 04/28/2016 0345   UROBILINOGEN 1.0 04/26/2016 1210   NITRITE POSITIVE (A) 04/28/2016 0345   LEUKOCYTESUR LARGE (A) 04/28/2016 0345   Urine culture: >100,000 GNB Blood culture: NGTD Hepatic panel: Negative Lipase: 56  Imaging/Diagnostic Tests: ERCP (04/30/2016) - Choledocholithiasis was found. Complete removal was accomplished by balloon extraction. - The biliary tree was swept.  US Abdomen Limited (04/28/2016) FINDINGS: Gallbladder: Multiple echogenic stones present within the gallbladder lumen, largest of which measured approximately 1 cm. Gallbladder wall measure within normal limits at 2 mm. Irregular fluid density present within knee liver adjacent to the gallbladder fossa, better seen on recent CT. No sonographic Murphy sign elicited on exam.  Common bile duct: Diameter: 12 mm. No definite hyperechoic stone seen within the dilated common bile duct.  Liver: Irregular fluid density within the liver adjacent to the gallbladder fossa, better seen on prior CT. Hepatic parenchyma somewhat heterogeneous.  IMPRESSION: 1. Cholelithiasis with dilatation of the common bile duct up to 12 mm. 2. Fluid opacity within the hepatic parenchyma adjacent to the gallbladder fossa, better seen on recent  CT. 3. Heterogeneous echotexture of the liver.  CT Abdomen Pelvis W Contrast (04/28/2016) FINDINGS: Lower chest: Small right lung base subpleural density most likely represents atelectatic changes. Infiltrate is less likely but not entirely excluded. Clinical correlation is recommended. Left lung base atelectatic changes noted.  No intra-abdominal free air or free fluid.  Hepatobiliary: The gallbladder is distended. No calcified stone identified. There is noncalcified stone versus sludge within the gallbladder lumen. Noncalcified debris suspected within the midportion of the common bile duct as well as within the central CBD at the head of the pancreas adjacent to the ampulla. There is dilatation of the CBD measuring up to 16 mm. There is also dilatation of the intrahepatic biliary ducts. There is thickened appearance of the gallbladder wall. There is a 4.0 x 1.0 cm inflammatory collection adjacent to the gallbladder in the subhepatic area concerning for an abscess. The liver is unremarkable.  Pancreas: Unremarkable. No pancreatic ductal dilatation or surrounding inflammatory changes.  Spleen: Normal in size without focal abnormality.  Adrenals/Urinary Tract: The adrenal glands appear unremarkable. The right kidney is atrophic. There is chronic mild dilatation of the right renal collecting system and right ureter. 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 There is mild left hydronephrosis. No obstructing stone identified. Correlation with urinalysis recommended to exclude acute UTI. The urinary bladder is unremarkable.  Stomach/Bowel: There is no evidence of bowel obstruction or active inflammation. Normal appendix.  Vascular/Lymphatic: The abdominal aorta and IVC appear unremarkable. The SMV, splenic vein, and main portal vein are patent.  No portal venous gas identified. There  is no adenopathy.  Reproductive: The prostate and seminal vesicles are grossly unremarkable.  Other: Right anterior chest wall VP shunt terminates in the subcutaneous soft tissues of the anterior abdominal wall. Right anterior pelvic wall scar. No fluid collection.  Musculoskeletal: Chronic dislocation of the hips. Non fusion of the lumbar posterior elements compatible with spina bifida. There is protrusion and exposure of the CSF field thecal sac compatible with a meningocele. No acute fracture. Postsurgical changes of meningocele repair in the lower back.  IMPRESSION: Dilatation of the intrahepatic and extrahepatic biliary tree as well as distended and inflamed gallbladder likely related to obstruction of the CBD with findings of cholecystitis and a small pericholecystic abscess. Associated acute cholangitis is not entirely excluded. Correlation with clinical exam and further evaluation with MRCP recommended to better evaluate for the gallbladder and CBD contents.  Mild left hydronephrosis. Right renal atrophy with chronic appearing dilatation of the right renal collecting system and ureter. Correlation with urinalysis recommended to exclude acute UTI.  Spina bifida and meningocele.  Chronic bilateral dislocated hips.  DG Chest 2 View (04/27/2016) FINDINGS: Right-sided ventriculoperitoneal shunt with irregularity and discontinuity of the tubing over the right flank. Lungs are somewhat hypoinflated with mild prominence of the perihilar markings. No focal airspace consolidation or effusion. Cardiomediastinal silhouette is within normal. Mild gastric distension.  IMPRESSION: Minimal prominence of the perihilar markings which may be due to the degree of hypoinflation, although cannot exclude viral bronchopneumonia or minimal vascular congestion.  Right-sided ventriculoperitoneal shunt with focal irregularity/ discontinuity of the shunt over the lateral thorax.    Wendee Beavers,  DO 05/02/2016, 7:30 AM PGY-1, [facility] FPTS Intern pager: [phone], text pages welcome

## 2016-05-02 NOTE — Progress Notes (Signed)
2 Days Post-Op  Subjective: He says he feels better but he doesn't want you to touch the his abdomen any place.  He is only getting 5 mg of the 5-10 mg ordered, he only got 20 mg for the entire day yesterday. Nurse says he has been refusing to take 2 tablets.  He ate pancakes for breakfast.  Allergic to Ibuprofen.    Objective: Vital signs in last 24 hours: Temp:  [98.5 F (36.9 C)-101 F (38.3 C)] 98.9 F (37.2 C) (02/01 0800) Pulse Rate:  [91-139] 111 (02/01 0800) Resp:  [19-38] 30 (02/01 0800) BP: (123-151)/(80-106) 139/92 (02/01 0800) SpO2:  [92 %-97 %] 96 % (02/01 0800) Last BM Date:  (PTA) 240 PO 2250 IV 350 from the drain Afebrile, still somewhat tachycardic; fever at 11 PM, up to 101. Saturations ok on RA WBC coming down K+ 2.9 LFT's improving  CMP Latest Ref Rng & Units 05/02/2016 05/01/2016 04/30/2016  Glucose 65 - 99 mg/dL 161(W) 95 960(A)  BUN 6 - 20 mg/dL 5(L) 11 12  Creatinine 0.61 - 1.24 mg/dL 5.40 9.81 1.91  Sodium 135 - 145 mmol/L 140 136 140  Potassium 3.5 - 5.1 mmol/L 2.9(L) 3.2(L) 4.1  Chloride 101 - 111 mmol/L 111 106 110  CO2 22 - 32 mmol/L 20(L) 20(L) 19(L)  Calcium 8.9 - 10.3 mg/dL 7.4(L) 7.7(L) 8.5(L)  Total Protein 6.5 - 8.1 g/dL 4.7(W) 2.9(F) 6.3(L)  Total Bilirubin 0.3 - 1.2 mg/dL 2.2(H) 3.0(H) 3.6(H)  Alkaline Phos 38 - 126 U/L 130(H) 165(H) 215(H)  AST 15 - 41 U/L 34 53(H) 43(H)  ALT 17 - 63 U/L 91(H) 131(H) 174(H)    Intake/Output from previous day: 01/31 0701 - 02/01 0700 In: 2590 [P.O.:240; I.V.:2250; IV Piggyback:100] Out: 350 [Drains:350] Intake/Output this shift: Total I/O In: 500 [I.V.:500] Out: 30 [Drains:30]  General appearance: alert, cooperative and no distress Resp: clear to auscultation bilaterally GI: soft tender any palpation, sites OK, jp is clear serous.    Lab Results:   Recent Labs  05/01/16 0337 05/02/16 0330  WBC 14.0* 11.3*  HGB 12.5* 11.8*  HCT 38.4* 37.6*  PLT 394 349    BMET  Recent Labs   05/01/16 0337 05/02/16 0330  NA 136 140  K 3.2* 2.9*  CL 106 111  CO2 20* 20*  GLUCOSE 95 105*  BUN 11 5*  CREATININE 0.89 0.74  CALCIUM 7.7* 7.4*   PT/INR No results for input(s): LABPROT, INR in the last 72 hours.   Recent Labs Lab 04/29/16 0250 04/29/16 0946 04/30/16 0411 05/01/16 0337 05/02/16 0330  AST 104* 91* 43* 53* 34  ALT 242* 233* 174* 131* 91*  ALKPHOS 236* 244* 215* 165* 130*  BILITOT 7.1* 6.7* 3.6* 3.0* 2.2*  PROT 5.9* 6.1* 6.3* 5.8* 5.5*  ALBUMIN 2.4* 2.6* 2.4* 2.2* 2.1*     Lipase     Component Value Date/Time   LIPASE 40 04/30/2016 0411     Studies/Results: Dg Chest 2 View  Result Date: 05/01/2016 CLINICAL DATA:  Acute onset of cough, congestion and tachypnea. Initial encounter. EXAM: CHEST  2 VIEW COMPARISON:  Chest radiograph performed 04/27/2016 FINDINGS: Mild left basilar airspace opacity may reflect pneumonia. No pleural effusion or pneumothorax is seen. The lungs are hypoexpanded. The heart is normal in size. No acute osseous abnormalities are seen. IMPRESSION: Left basilar airspace opacity raises concern for pneumonia. Lungs hypoexpanded. Electronically Signed   By: Roanna Raider M.D.   On: 05/01/2016 21:19     Prior to  Admission medications   Medication Sig Start Date End Date Taking? Authorizing Provider  oxybutynin (DITROPAN) 5 MG tablet Take 2 tablets in the morning, 1 tablet at lunch, and 2 tablets at night for bladder spasm. Patient taking differently: Take 5-10 mg by mouth See admin instructions. Take 2 tablets in the morning, 1 tablet at lunch, and 2 tablets at night for bladder spasm. 10/04/13  Yes Hector RisingKyle J Fletke, MD     Medications: . heparin  5,000 Units Subcutaneous Q8H  . piperacillin-tazobactam (ZOSYN)  IV  3.375 g Intravenous Q8H  . potassium chloride  40 mEq Oral TID  . sodium chloride flush  3 mL Intravenous Q12H   . sodium chloride 125 mL/hr at 05/02/16 [id number]     Spina bifida with hydrocephalus - wheelchair  bound 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 Assessment/Plan Cholangitis, cholecystitis, peri-cholecystic abscess S/p ERCP 04/29/16, Dr. Elnoria HowardHung Calculus of gallbladder with acute cholecystitis, without mention of obstruction Laparoscopic cholecystectomy with the posterior wall left in place, drain placement, 04/30/16, Dr. Gaynelle AduEric Shea FEN:  IV fluids ID:  Zosyn 04/27/16 =>> day 6 DVT:  Heparin  PLan:  Increase pain meds and see if we can get him up to chair.  He reports working on IS frequently. I am putting him on some Tylenol q6h also.       LOS: 4 days    Hector Shea 05/02/2016 [phone]

## 2016-05-02 NOTE — Progress Notes (Signed)
Spoke with Neurosurgeon Dr. Danielle DessElsner about incidental finding of VP shunt that appears to be split. He stated this can occur when patients outgrow their shunts. Without neurological symptoms, does not need to be seen by Neurosurgery urgently at this time. Will obtain further history from patient about what he knows about VP shunt function and [facility].   Dani GobbleHillary Karagan Lehr, MD [facility], PGY-2

## 2016-05-02 NOTE — Consult Note (Signed)
WOC Nurse wound consult note Reason for Consult: sacral wound Wound type: MASD (moisture associated skin damage) with evidence of scarring from previous skin breakdown Pressure Injury POA:/No Measurement: no open wounds Wound bed: pink tissue Drainage (amount, consistency, odor)  none Periwound: intact  Dressing procedure/placement/frequency: Barrier cream or silicone foam to protect area from further damage and from incontinence.

## 2016-05-02 NOTE — Care Management Important Message (Signed)
Important Message  Patient Details  Name: Hector Shea MRN: [id number] Date of Birth: [birth year]   Medicare Important Message Given:  Yes    Raaga Maeder Abena 05/02/2016, 12:03 PM

## 2016-05-03 LAB — COMPREHENSIVE METABOLIC PANEL
ALK PHOS: 106 U/L (ref 38–126)
ALT: 66 U/L — AB (ref 17–63)
ANION GAP: 8 (ref 5–15)
AST: 21 U/L (ref 15–41)
Albumin: 1.9 g/dL — ABNORMAL LOW (ref 3.5–5.0)
BUN: 6 mg/dL (ref 6–20)
CALCIUM: 7.5 mg/dL — AB (ref 8.9–10.3)
CO2: 23 mmol/L (ref 22–32)
CREATININE: 0.83 mg/dL (ref 0.61–1.24)
Chloride: 111 mmol/L (ref 101–111)
GFR calc Af Amer: >60 mL/min (ref >60)
GFR calc non Af Amer: >60 mL/min (ref >60)
Glucose, Bld: 116 mg/dL — ABNORMAL HIGH (ref 65–99)
Potassium: 3.8 mmol/L (ref 3.5–5.1)
SODIUM: 142 mmol/L (ref 135–145)
TOTAL PROTEIN: 5.2 g/dL — AB (ref 6.5–8.1)
Total Bilirubin: 1.5 mg/dL — ABNORMAL HIGH (ref 0.3–1.2)

## 2016-05-03 LAB — CBC
HCT: 35.2 % — ABNORMAL LOW (ref 39.0–52.0)
Hemoglobin: 11 g/dL — ABNORMAL LOW (ref 13.0–17.0)
MCH: 28.6 pg (ref 26.0–34.0)
MCHC: 31.3 g/dL (ref 30.0–36.0)
MCV: 91.7 fL (ref 78.0–100.0)
PLATELETS: 391 10*3/uL (ref 150–400)
RBC: 3.84 MIL/uL — ABNORMAL LOW (ref 4.22–5.81)
RDW: 15.6 % — AB (ref 11.5–15.5)
WBC: 11.1 10*3/uL — ABNORMAL HIGH (ref 4.0–10.5)

## 2016-05-03 LAB — CULTURE, BLOOD (ROUTINE X 2)
CULTURE: NO GROWTH
Culture: NO GROWTH

## 2016-05-03 LAB — URINE CULTURE: Culture: 80000 — AB

## 2016-05-03 MED ORDER — VANCOMYCIN HCL IN DEXTROSE 1-5 GM/200ML-% IV SOLN
1000.0000 mg | Freq: Three times a day (TID) | INTRAVENOUS | Status: DC
  Administered 2016-05-03 – 2016-05-06 (×10): 1000 mg via INTRAVENOUS
  Filled 2016-05-03 (×10): qty 200

## 2016-05-03 MED ORDER — VANCOMYCIN HCL 10 G IV SOLR
1250.0000 mg | Freq: Once | INTRAVENOUS | Status: AC
  Administered 2016-05-03: 1250 mg via INTRAVENOUS
  Filled 2016-05-03: qty 1250

## 2016-05-03 MED ORDER — FLUCONAZOLE 100 MG PO TABS
200.0000 mg | ORAL_TABLET | Freq: Every day | ORAL | Status: DC
  Administered 2016-05-03 – 2016-05-06 (×4): 200 mg via ORAL
  Filled 2016-05-03 (×3): qty 2
  Filled 2016-05-03: qty 1

## 2016-05-03 MED ORDER — POLYETHYLENE GLYCOL 3350 17 G PO PACK
17.0000 g | PACK | Freq: Every day | ORAL | Status: DC
  Administered 2016-05-03 – 2016-05-06 (×4): 17 g via ORAL
  Filled 2016-05-03 (×4): qty 1

## 2016-05-03 MED ORDER — PIPERACILLIN-TAZOBACTAM 3.375 G IVPB
3.3750 g | Freq: Three times a day (TID) | INTRAVENOUS | Status: AC
  Administered 2016-05-03 – 2016-05-06 (×9): 3.375 g via INTRAVENOUS
  Filled 2016-05-03 (×9): qty 50

## 2016-05-03 MED ORDER — DOCUSATE SODIUM 100 MG PO CAPS
100.0000 mg | ORAL_CAPSULE | Freq: Two times a day (BID) | ORAL | Status: DC
  Administered 2016-05-03 – 2016-05-06 (×7): 100 mg via ORAL
  Filled 2016-05-03 (×7): qty 1

## 2016-05-03 NOTE — Progress Notes (Addendum)
Pharmacy Antibiotic Note  Hector Shea is a 26 y.o. male admitted on 04/28/2016 with yeast in urine cx.  Pharmacy has been consulted for fluconazole dosing.  Tmax of 100.4 yesterday, WBC down to 11.1. No urinary symptoms documented, could be contaminant but still having low grade fevers.  Plan: Start fluconazole 200mg  PO Q24 Stop Zosyn today Continue vancomycin 1g IV Q8 Monitor clinical picture, renal function, VT prn F/U C&S, abx deescalation / LOT  Consider need for vancomycin if cx's remain negative  Height: 5' (152.4 cm) Weight: 138 lb 14.2 oz (63 kg) IBW/kg (Calculated) : 50  Temp (24hrs), Avg:99.3 F (37.4 C), Min:98.1 F (36.7 C), Max:100.4 F (38 C)   Recent Labs Lab 04/28/16 0118 04/29/16 0250 04/29/16 0946 04/30/16 0411 05/01/16 0337 05/02/16 0330 05/03/16 0249  WBC  --  8.6  --  8.0 14.0* 11.3* 11.1*  CREATININE  --  0.82 0.96 0.91 0.89 0.74 0.83  LATICACIDVEN 0.84  --   --   --   --   --   --     Estimated Creatinine Clearance: 107.1 mL/min (by C-G formula based on SCr of 0.83 mg/dL).    Allergies  Allergen Reactions  . Advil [Ibuprofen]     Antimicrobials this admission: Vanc 1/28 x 1; 2/2 >> Zosyn 1/28 >> 2/2 Cipro 1/28 x1 Fluconazole 2/2 >>  Dose adjustments this admission: n/a  Microbiology results: 1/28 BCx x2: ngtd 1/28 MRSA PCR: negative  Thank you for allowing pharmacy to be a part of this patient's care.  Enzo BiNathan Batchelder, PharmD, BCPS Clinical Pharmacist Pager [phone] 05/03/2016 9:57 AM   ---------------- Rica MoteADDENDUM 05/03/2016 11:12 AM   Zosyn order had stop date entered on original order so fell off med list. Per FMTS and surgey note, planned for 5 days of post-op ABX. Re-ordered zosyn to continue through 2/4 to complete course.   Plan: Start fluconazole 200mg  PO Q24 Continue Zosyn 3.375g EI q8h through 2/4 Continue vancomycin 1g IV Q8 Monitor clinical picture, renal function, VT prn F/U C&S, abx deescalation  / LOT   Allena Katzaroline E Welles, Pharm.D. PGY1 Pharmacy Resident 2/2/201811:18 AM Pager [phone]

## 2016-05-03 NOTE — Progress Notes (Signed)
3 Days Post-Op  Subjective: Less pain today. No n/v. Tolerating a diet; Tmax 100.4; HR better  Objective: Vital signs in last 24 hours: Temp:  [98.1 F (36.7 C)-100.4 F (38 C)] 98.1 F (36.7 C) (02/02 0815) Pulse Rate:  [81-143] 98 (02/02 0815) Resp:  [13-30] 18 (02/02 0815) BP: (109-146)/(76-94) 134/86 (02/02 0815) SpO2:  [90 %-96 %] 96 % (02/02 0815) Last BM Date:  (PTA)  Intake/Output from previous day: 02/01 0701 - 02/02 0700 In: 3360 [I.V.:2875; IV Piggyback:450] Out: 1640 [Urine:1500; Drains:140] Intake/Output this shift: Total I/O In: -  Out: 1530 [Urine:1500; Drains:30]  Looks more comfortable today nonlabored Soft, nd, incisions c/d/i; approp TTP throughout [place]  Lab Results:   Recent Labs  05/02/16 0330 05/03/16 0249  WBC 11.3* 11.1*  HGB 11.8* 11.0*  HCT 37.6* 35.2*  PLT 349 391   BMET  Recent Labs  05/02/16 0330 05/03/16 0249  NA 140 142  K 2.9* 3.8  CL 111 111  CO2 20* 23  GLUCOSE 105* 116*  BUN 5* 6  CREATININE 0.74 0.83  CALCIUM 7.4* 7.5*   PT/INR No results for input(s): LABPROT, INR in the last 72 hours. ABG No results for input(s): PHART, HCO3 in the last 72 hours.  Invalid input(s): PCO2, PO2  Studies/Results: Dg Chest 2 View  Result Date: 05/01/2016 CLINICAL DATA:  Acute onset of cough, congestion and tachypnea. Initial encounter. EXAM: CHEST  2 VIEW COMPARISON:  Chest radiograph performed 04/27/2016 FINDINGS: Mild left basilar airspace opacity may reflect pneumonia. No pleural effusion or pneumothorax is seen. The lungs are hypoexpanded. The heart is normal in size. No acute osseous abnormalities are seen. IMPRESSION: Left basilar airspace opacity raises concern for pneumonia. Lungs hypoexpanded. Electronically Signed   By: Roanna Raider M.D.   On: 05/01/2016 21:19   Ct Angio Chest Aorta W/cm &/or Wo/cm  Result Date: 05/02/2016 CLINICAL DATA:  Fever.  Tachycardia.  Tachypnea. EXAM: CT ANGIOGRAPHY CHEST WITH  CONTRAST TECHNIQUE: Multidetector CT imaging of the chest was performed using the standard protocol during bolus administration of intravenous contrast. Multiplanar CT image reconstructions and MIPs were obtained to evaluate the vascular anatomy. CONTRAST:  100 cc Isovue 370 COMPARISON:  Radiography 05/01/2016 FINDINGS: Cardiovascular: The aorta is normal. No atherosclerosis. No dissection or aneurysm. No coronary calcifications seen. Pulmonary arterial tree appears normal. Normal heart size. No pericardial fluid. Mediastinum/Nodes: No mass or lymphadenopathy. Lungs/Pleura: Right effusion layering dependently. Volume loss/infiltrate in both lower lobes. Upper Abdomen: It appears that there has been cholecystectomy. Postoperative changes within the gallbladder fossa not primarily or completely evaluated. Small amount of free intraperitoneal air postoperatively. Musculoskeletal: Negative Review of the MIP images confirms the above findings. IMPRESSION: Right effusion.  Bilateral lower lobe atelectasis and/or pneumonia. No aortic, coronary or pulmonary arterial pathology identified. Recent cholecystectomy. Postoperative changes in the gallbladder fossa of. Small amount of free intraperitoneal air, not unexpected. Electronically Signed   By: Paulina Fusi M.D.   On: 05/02/2016 13:03    Anti-infectives: Anti-infectives    Start     Dose/Rate Route Frequency Ordered Stop   05/03/16 1000  vancomycin (VANCOCIN) IVPB 1000 mg/200 mL premix     1,000 mg 200 mL/hr over 60 Minutes Intravenous Every 8 hours 05/03/16 0221     05/03/16 0300  vancomycin (VANCOCIN) 1,250 mg in sodium chloride 0.9 % 250 mL IVPB     1,250 mg 166.7 mL/hr over 90 Minutes Intravenous  Once 05/03/16 0221 05/03/16 0536   05/01/16 1400  piperacillin-tazobactam (  ZOSYN) IVPB 3.375 g     3.375 g 12.5 mL/hr over 240 Minutes Intravenous Every 8 hours 05/01/16 1356 05/03/16 1359   04/30/16 1830  piperacillin-tazobactam (ZOSYN) IVPB 3.375 g     3.375  g 12.5 mL/hr over 240 Minutes Intravenous Every 8 hours 04/30/16 1457 05/01/16 0604   04/28/16 1400  piperacillin-tazobactam (ZOSYN) IVPB 3.375 g  Status:  Discontinued     3.375 g 100 mL/hr over 30 Minutes Intravenous Every 8 hours 04/28/16 1006 04/28/16 1007   04/28/16 1030  piperacillin-tazobactam (ZOSYN) IVPB 3.375 g  Status:  Discontinued     3.375 g 12.5 mL/hr over 240 Minutes Intravenous Every 8 hours 04/28/16 1008 04/30/16 1457   04/28/16 0730  ciprofloxacin (CIPRO) IVPB 400 mg  Status:  Discontinued     400 mg 200 mL/hr over 60 Minutes Intravenous Every 12 hours 04/28/16 0657 04/28/16 1006   04/28/16 0215  vancomycin (VANCOCIN) IVPB 1000 mg/200 mL premix     1,000 mg 200 mL/hr over 60 Minutes Intravenous  Once 04/28/16 0211 04/28/16 0451   04/28/16 0215  piperacillin-tazobactam (ZOSYN) IVPB 3.375 g     3.375 g 100 mL/hr over 30 Minutes Intravenous  Once 04/28/16 0211 04/28/16 [id number]      Assessment/Plan: Cholangitis, cholecystitis, peri-cholecystic abscess S/p ERCP 04/29/16, Dr. Elnoria HowardHung Calculus of gallbladder with acute cholecystitis, without mention of obstruction Laparoscopic cholecystectomy with the posterior wall left in place, drain placement, 04/30/16, Dr. Gaynelle AduEric Janaysia Mcleroy FEN:  kvo, reg diet ID:  Laqueta JeanZosyn 04/27/16 =>> day 6 DVT:  Heparin  Added bowel regimen Looks better today Cont iv abx today Probably ok for discharge from a abdominal standpoint in the next 24-48hrs.  rec total of 5 days of abx after surgery (assuming blood cx stay negative)  Mary SellaEric M. Andrey CampanileWilson, MD, FACS General, Bariatric, & Minimally Invasive Surgery [facility] [place]   LOS: 5 days    Atilano InaWILSON,Kjirsten Bloodgood M 05/03/2016

## 2016-05-03 NOTE — Progress Notes (Signed)
Family Medicine Teaching Service Daily Progress Note Intern Pager: [phone]  Patient name: Hector Shea Medical record number: [id number] Date of birth: 01-14-1991 Age: 26 y.o. Gender: male  Primary Care Provider: [name], MD Consultants: Gastroenterology, general surgery Code Status: Full  Pt Overview and Major Events to Date:  01/28: Admit for RUQ pain concerning for acute cholangitis 01/29: ERCP for ascending cholangitis 01/30: OR for lap-chole 02/01: Spiked fever 101F at [place] while on IV Zosyn, BCx drawn, CTA ordered neg for PE  Assessment and Plan: Hector Shea is a 26 y.o. male presenting with worsening abdominal pain. PMH is significant for spina bifida with hydrocephalus, ventricular shunt, and neurogenic bladder with incontinence.  #Ascending cholangitis; POD1 s/p lap-chole: Acute, improved. Upper quadrant pain. S/p ERCP on 1/29 with removal of many stones. Patient to underwent laparoscopic cholecystectomy on 1/31. Patient continues to be tachycardic with low grade intermittent fevers. Leukocytosis improving. Currently on Zosyn, vancomycin added. Liver enzymes continue to improve. Receiving tylenol scheduled but refused yesterday afternoon and spiked temp 100.76F in the evening. --Gastroenterology consulted, appreciate recommendations, signed off --General surgery consulted, appreciate recommendations --IV Zosyn per GI recommendations (day 6, 1/28>), vancomycin added due to fever (day 1, 2/2>) --CMET and CBC daily --Blood cultures NGTD --Repeat UCx pending s/p fever while on IV Zosyn --NPO --OxyIR 5-10 mg q4h PRN --Fentanyl 25-50 mcg q1h PRN --Tylenol 1 g q6h scheduled  #Tachycardia and dyspnea without hypoxia: Acute, persists. HR in 110s. Likely secondary to pain secondary to surgery. CTA neg for PE. Patient spiked fever while on IV Zosyn. Could be secondary to infection not covered by Zosyn, vancomycin added. UCx repeated and  pending. --Control pain per above --Repeat UCx pending  #Hypokalemia and hypomagnesemia: Acute, Resolved. Repleting as needed. Patient remains NPO. --Replete K as needed --Replete Mg as needed  #Stage II decubitus ulcer: Chronic, stable. No signs of active infection. WOC consulted given tachycardia and recent fever. RN to change dressing. --WOC recommends barrier cream or silicone foam  #Neurogenic bladder with incontinence: 2/2 spina bifida. In and out cath at home --Repeat UCx per above  FEN/GI: NS IV @125ml /hr; NPO Prophylaxis: Heparin SQ  Disposition: Pending improvement of tachycardia and repeat UCx.  Subjective:  Patient continues to have abdominal pain with touch. Says he is receiving pain medications which help. Shortness of breath improving saying he is performing better with incentive spirometry. Able to tolerate liquid diet without passage of stool yet. Denies CP, N/V, or worsening abdominal pain.  Objective: Temp:  [98.1 F (36.7 C)-100.4 F (38 C)] 98.1 F (36.7 C) (02/02 0815) Pulse Rate:  [81-143] 98 (02/02 0815) Resp:  [13-30] 18 (02/02 0815) BP: (109-146)/(76-94) 134/86 (02/02 0815) SpO2:  [90 %-96 %] 96 % (02/02 0815) Physical Exam: General: well nourished, well developed, no distress with non-toxic appearance HEENT: normocephalic, atraumatic, moist mucous membranes CV: tachycardic with regular rhythm without murmurs, rubs, or gallops Lungs: clear to auscultation bilaterally with normal work of breathing Abdomen: soft, moderately tender near sites of incision without rebound or guarding, normoactive bowel sounds, dressings dry without surrounding erythema or edema Skin: warm, dry, no rashes or lesions, cap refill < 2 seconds Extremities: warm and well perfused, normal tone   Laboratory:  Recent Labs Lab 05/01/16 0337 05/02/16 0330 05/03/16 0249  WBC 14.0* 11.3* 11.1*  HGB 12.5* 11.8* 11.0*  HCT 38.4* 37.6* 35.2*  PLT 394 349 391    Recent  Labs Lab 05/01/16 0337 05/02/16 0330 05/03/16 0249  NA 136 140 142  K 3.2* 2.9* 3.8  CL 106 111 111  CO2 20* 20* 23  BUN 11 5* 6  CREATININE 0.89 0.74 0.83  CALCIUM 7.7* 7.4* 7.5*  PROT 5.8* 5.5* 5.2*  BILITOT 3.0* 2.2* 1.5*  ALKPHOS 165* 130* 106  ALT 131* 91* 66*  AST 53* 34 21  GLUCOSE 95 105* 116*   Urinalysis    Component Value Date/Time   COLORURINE AMBER (A) 04/28/2016 0345   APPEARANCEUR HAZY (A) 04/28/2016 0345   LABSPEC 1.016 04/28/2016 0345   PHURINE 6.0 04/28/2016 0345   GLUCOSEU NEGATIVE 04/28/2016 0345   HGBUR MODERATE (A) 04/28/2016 0345   BILIRUBINUR NEGATIVE 04/28/2016 0345   BILIRUBINUR LARGE 04/26/2016 1210   KETONESUR 5 (A) 04/28/2016 0345   PROTEINUR NEGATIVE 04/28/2016 0345   UROBILINOGEN 1.0 04/26/2016 1210   NITRITE POSITIVE (A) 04/28/2016 0345   LEUKOCYTESUR LARGE (A) 04/28/2016 0345   Urine culture: >100,000 GNB Repeat Urine culture: Pending Blood culture (1/28): NGTD Repeat Blood culture (1/31): NGTD Hepatic panel: Negative Lipase: 56  Imaging/Diagnostic Tests: ERCP (04/30/2016) - Choledocholithiasis was found. Complete removal was accomplished by balloon extraction. - The biliary tree was swept.  US Abdomen Limited (04/28/2016) FINDINGS: Gallbladder: Multiple echogenic stones present within the gallbladder lumen, largest of which measured approximately 1 cm. Gallbladder wall measure within normal limits at 2 mm. Irregular fluid density present within knee liver adjacent to the gallbladder fossa, better seen on recent CT. No sonographic Murphy sign elicited on exam.  Common bile duct: Diameter: 12 mm. No definite hyperechoic stone seen within the dilated common bile duct.  Liver: Irregular fluid density within the liver adjacent to the gallbladder fossa, better seen on prior CT. Hepatic parenchyma somewhat heterogeneous.  IMPRESSION: 1. Cholelithiasis with dilatation of the common bile duct up to 12 mm. 2. Fluid opacity within  the hepatic parenchyma adjacent to the gallbladder fossa, better seen on recent CT. 3. Heterogeneous echotexture of the liver.  CT Abdomen Pelvis W Contrast (04/28/2016) FINDINGS: Lower chest: Small right lung base subpleural density most likely represents atelectatic changes. Infiltrate is less likely but not entirely excluded. Clinical correlation is recommended. Left lung base atelectatic changes noted.  No intra-abdominal free air or free fluid.  Hepatobiliary: The gallbladder is distended. No calcified stone identified. There is noncalcified stone versus sludge within the gallbladder lumen. Noncalcified debris suspected within the midportion of the common bile duct as well as within the central CBD at the head of the pancreas adjacent to the ampulla. There is dilatation of the CBD measuring up to 16 mm. There is also dilatation of the intrahepatic biliary ducts. There is thickened appearance of the gallbladder wall. There is a 4.0 x 1.0 cm inflammatory collection adjacent to the gallbladder in the subhepatic area concerning for an abscess. The liver is unremarkable.  Pancreas: Unremarkable. No pancreatic ductal dilatation or surrounding inflammatory changes.  Spleen: Normal in size without focal abnormality.  Adrenals/Urinary Tract: The adrenal glands appear unremarkable. The right kidney is atrophic. There is chronic mild dilatation of the right renal collecting system and right ureter. 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 There is mild left hydronephrosis. No obstructing stone identified. Correlation with urinalysis recommended to exclude acute UTI. The urinary bladder is unremarkable.  Stomach/Bowel: There is no evidence of bowel obstruction or active inflammation. Normal appendix.  Vascular/Lymphatic: The abdominal aorta and IVC appear unremarkable.  The SMV, splenic  vein, and main portal vein are patent. No portal venous gas identified. There is no adenopathy.  Reproductive: The prostate and seminal vesicles are grossly unremarkable.  Other: Right anterior chest wall VP shunt terminates in the subcutaneous soft tissues of the anterior abdominal wall. Right anterior pelvic wall scar. No fluid collection.  Musculoskeletal: Chronic dislocation of the hips. Non fusion of the lumbar posterior elements compatible with spina bifida. There is protrusion and exposure of the CSF field thecal sac compatible with a meningocele. No acute fracture. Postsurgical changes of meningocele repair in the lower back.  IMPRESSION: Dilatation of the intrahepatic and extrahepatic biliary tree as well as distended and inflamed gallbladder likely related to obstruction of the CBD with findings of cholecystitis and a small pericholecystic abscess. Associated acute cholangitis is not entirely excluded. Correlation with clinical exam and further evaluation with MRCP recommended to better evaluate for the gallbladder and CBD contents.  Mild left hydronephrosis. Right renal atrophy with chronic appearing dilatation of the right renal collecting system and ureter. Correlation with urinalysis recommended to exclude acute UTI.  Spina bifida and meningocele.  Chronic bilateral dislocated hips.  DG Chest 2 View (04/27/2016) FINDINGS: Right-sided ventriculoperitoneal shunt with irregularity and discontinuity of the tubing over the right flank. Lungs are somewhat hypoinflated with mild prominence of the perihilar markings. No focal airspace consolidation or effusion. Cardiomediastinal silhouette is within normal. Mild gastric distension.  IMPRESSION: Minimal prominence of the perihilar markings which may be due to the degree of hypoinflation, although cannot exclude viral bronchopneumonia or minimal vascular congestion.  Right-sided ventriculoperitoneal shunt with focal irregularity/  discontinuity of the shunt over the lateral thorax.    Wendee Beaversavid J Aviah Sorci, DO 05/03/2016, 9:18 AM PGY-1, [facility] FPTS Intern pager: [phone], text pages welcome

## 2016-05-03 NOTE — Progress Notes (Signed)
Pharmacy Antibiotic Note  [name] is a 26 y.o. male s/p cholecystectomy 1/30 on Zosyn, now with fevers  Pharmacy has been consulted for Vancomycin  dosing.  Plan: Vancomycin 1250 mg IV now, then 1 g IV q8h  Height: 5' (152.4 cm) Weight: 138 lb 14.2 oz (63 kg) IBW/kg (Calculated) : 50  Temp (24hrs), Avg:99.5 F (37.5 C), Min:98.6 F (37 C), Max:100.4 F (38 C)   Recent Labs Lab 04/27/16 2120 04/28/16 0118 04/29/16 0250 04/29/16 0946 04/30/16 0411 05/01/16 0337 05/02/16 0330  WBC 14.2*  --  8.6  --  8.0 14.0* 11.3*  CREATININE 1.06  --  0.82 0.96 0.91 0.89 0.74  LATICACIDVEN  --  0.84  --   --   --   --   --     Estimated Creatinine Clearance: 111.2 mL/min (by C-G formula based on SCr of 0.74 mg/dL).    Allergies  Allergen Reactions  . Advil [Ibuprofen]     Eddie Candlebbott, Krystl Wickware Vernon 05/03/2016 2:16 AM

## 2016-05-04 LAB — COMPREHENSIVE METABOLIC PANEL
ALBUMIN: 2 g/dL — AB (ref 3.5–5.0)
ALK PHOS: 102 U/L (ref 38–126)
ALT: 48 U/L (ref 17–63)
AST: 18 U/L (ref 15–41)
Anion gap: 10 (ref 5–15)
BILIRUBIN TOTAL: 1.6 mg/dL — AB (ref 0.3–1.2)
BUN: 6 mg/dL (ref 6–20)
CO2: 26 mmol/L (ref 22–32)
Calcium: 8.2 mg/dL — ABNORMAL LOW (ref 8.9–10.3)
Chloride: 105 mmol/L (ref 101–111)
Creatinine, Ser: 0.8 mg/dL (ref 0.61–1.24)
GFR calc Af Amer: >60 mL/min (ref >60)
GFR calc non Af Amer: >60 mL/min (ref >60)
GLUCOSE: 130 mg/dL — AB (ref 65–99)
POTASSIUM: 3.6 mmol/L (ref 3.5–5.1)
Sodium: 141 mmol/L (ref 135–145)
TOTAL PROTEIN: 5.4 g/dL — AB (ref 6.5–8.1)

## 2016-05-04 LAB — CBC
HEMATOCRIT: 35.5 % — AB (ref 39.0–52.0)
Hemoglobin: 11.4 g/dL — ABNORMAL LOW (ref 13.0–17.0)
MCH: 29.1 pg (ref 26.0–34.0)
MCHC: 32.1 g/dL (ref 30.0–36.0)
MCV: 90.6 fL (ref 78.0–100.0)
Platelets: 510 10*3/uL — ABNORMAL HIGH (ref 150–400)
RBC: 3.92 MIL/uL — ABNORMAL LOW (ref 4.22–5.81)
RDW: 15.4 % (ref 11.5–15.5)
WBC: 11.9 10*3/uL — ABNORMAL HIGH (ref 4.0–10.5)

## 2016-05-04 MED ORDER — ACETAMINOPHEN 500 MG PO TABS: 500.0000 mg | ORAL_TABLET | Freq: Four times a day (QID) | ORAL | Status: DC | PRN

## 2016-05-04 NOTE — Progress Notes (Signed)
4 Days Post-Op  Subjective: He looks good, still tender, but less tender than 2 days ago.  Tolerating diet.  Sites all look good.  Objective: Vital signs in last 24 hours: Temp:  [97.6 F (36.4 C)-99 F (37.2 C)] 98.4 F (36.9 C) (02/03 0539) Pulse Rate:  [95-106] 98 (02/03 0539) Resp:  [16-34] 16 (02/03 0539) BP: (113-138)/(76-99) 130/86 (02/03 0539) SpO2:  [93 %-97 %] 93 % (02/03 0539) Last BM Date: 05/03/16 1230 PO 2800 IV fluids Urine 4000 Drain 110 No BM recorded Afebrile, VSS - tachycardia improving Bilirubin coming down, WBC is stable CT 05/02/16 Intake/Output from previous day: 02/02 0701 - 02/03 0700 In: 3963.3 [P.O.:1230; I.V.:1983.3; IV Piggyback:750] Out: 4110 [Urine:4000; Drains:110] Intake/Output this shift: Total I/O In: -  Out: 1800 [Urine:1800]  General appearance: alert, cooperative and no distress GI: soft, less tender, sites look fine.  drainage is clear  Lab Results:   Recent Labs  05/02/16 0330 05/03/16 0249  WBC 11.3* 11.1*  HGB 11.8* 11.0*  HCT 37.6* 35.2*  PLT 349 391    BMET  Recent Labs  05/02/16 0330 05/03/16 0249  NA 140 142  K 2.9* 3.8  CL 111 111  CO2 20* 23  GLUCOSE 105* 116*  BUN 5* 6  CREATININE 0.74 0.83  CALCIUM 7.4* 7.5*   PT/INR No results for input(s): LABPROT, INR in the last 72 hours.   Recent Labs Lab 04/29/16 0946 04/30/16 0411 05/01/16 0337 05/02/16 0330 05/03/16 0249  AST 91* 43* 53* 34 21  ALT 233* 174* 131* 91* 66*  ALKPHOS 244* 215* 165* 130* 106  BILITOT 6.7* 3.6* 3.0* 2.2* 1.5*  PROT 6.1* 6.3* 5.8* 5.5* 5.2*  ALBUMIN 2.6* 2.4* 2.2* 2.1* 1.9*     Lipase     Component Value Date/Time   LIPASE 40 04/30/2016 0411     Studies/Results: Ct Angio Chest Aorta W/cm &/or Wo/cm  Result Date: 05/02/2016 CLINICAL DATA:  Fever.  Tachycardia.  Tachypnea. EXAM: CT ANGIOGRAPHY CHEST WITH CONTRAST TECHNIQUE: Multidetector CT imaging of the chest was performed using the standard protocol during bolus  administration of intravenous contrast. Multiplanar CT image reconstructions and MIPs were obtained to evaluate the vascular anatomy. CONTRAST:  100 cc Isovue 370 COMPARISON:  Radiography 05/01/2016 FINDINGS: Cardiovascular: The aorta is normal. No atherosclerosis. No dissection or aneurysm. No coronary calcifications seen. Pulmonary arterial tree appears normal. Normal heart size. No pericardial fluid. Mediastinum/Nodes: No mass or lymphadenopathy. Lungs/Pleura: Right effusion layering dependently. Volume loss/infiltrate in both lower lobes. Upper Abdomen: It appears that there has been cholecystectomy. Postoperative changes within the gallbladder fossa not primarily or completely evaluated. Small amount of free intraperitoneal air postoperatively. Musculoskeletal: Negative Review of the MIP images confirms the above findings. IMPRESSION: Right effusion.  Bilateral lower lobe atelectasis and/or pneumonia. No aortic, coronary or pulmonary arterial pathology identified. Recent cholecystectomy. Postoperative changes in the gallbladder fossa of. Small amount of free intraperitoneal air, not unexpected. Electronically Signed   By: Paulina FusiMark  Shogry M.D.   On: 05/02/2016 13:03    Medications: . acetaminophen  1,000 mg Oral Q6H  . docusate sodium  100 mg Oral BID  . fluconazole  200 mg Oral Daily  . heparin  5,000 Units Subcutaneous Q8H  . piperacillin-tazobactam (ZOSYN)  IV  3.375 g Intravenous Q8H  . polyethylene glycol  17 g Oral Daily  . sodium chloride flush  3 mL Intravenous Q12H  . vancomycin  1,000 mg Intravenous Q8H   . sodium chloride 50 mL/hr at 05/03/16  2105    Spina bifida with hydrocephalus - wheelchair bound 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 Assessment/Plan Cholangitis, cholecystitis, peri-cholecystic abscess S/p ERCP 04/29/16, Dr. Elnoria Howard Calculus of gallbladder with acute cholecystitis, without mention of  obstruction Laparoscopic cholecystectomy with the posterior wall left in place, drain placement, 04/30/16, Dr. Gaynelle Adu POD 5 FEN:  IV fluids/regular diet ID:  Zosyn 04/27/16 =>> day 8  Diflucan =>> started 05/03/16 =>> day 2 DVT:  Heparin   Plan>  Dr. Andrey Campanile recommended a total of 5 days of antibiotics after surgery.  He has follow up instruction for follow up in our office.  I will check on sending home with drain.  If he does go home with drain we need to set up with an appointment to pull drain next week.      LOS: 6 days    [name] 05/04/2016 [phone]

## 2016-05-04 NOTE — Progress Notes (Signed)
Family Medicine Teaching Service Daily Progress Note Intern Pager: [phone]  Patient name: Hector Shea Medical record number: [id number] Date of birth: 1991-02-20 Age: 26 y.o. Gender: male  Primary Care Provider: [name], MD Consultants: Gastroenterology, general surgery Code Status: Full  Assessment and Plan: 26 y.o. male presenting with worsening abdominal pain. PMH is significant for spina bifida with hydrocephalus, ventricular shunt, and neurogenic bladder with incontinence.  #Ascending cholangitis; POD 4 s/p lap-chole: Acute, improved. S/p ERCP on 1/29 with removal of many stones. Laparoscopic cholecystectomy on 1/31. --CMET and CBC daily --Blood cultures NGTD  --General surgery consulted, appreciate recommendations. Determining discharge w/ vs. W/o drain. --IV Zosyn per GI recommendations (day 7, 1/28>), vancomycin added due to fever (day 2, 2/2>). To continue for 5 days following surgery (Stop Date 2/5) --OxyIR 5-10 mg q4h PRN --Tylenol 1 g q6h scheduled  #Tachycardia and dyspnea without hypoxia:  Improved. Acute, persists. HR in 110s. Likely secondary to pain secondary to surgery. CTA neg for PE.  --Control pain per above  #Yeast UTI. H/O Neurogenic bladder with incontinence 2/2 spina bifida. In and out cath at home. Urine culture with 80,000 colonies/mL Yeast. --Diflucan  FEN/GI: NS IV @125ml /hr; NPO Prophylaxis: Heparin SQ  Disposition: Pending improvement of tachycardia and repeat UCx.  Subjective:  Notes he is continuing to feel better. Still with some abdominal pain, but improved. Denies fever. Last bowel movement yesterday.  Objective: Temp:  [97.6 F (36.4 C)-99 F (37.2 C)] 98.4 F (36.9 C) (02/03 0539) Pulse Rate:  [95-106] 98 (02/03 0539) Resp:  [16-34] 16 (02/03 0539) BP: (113-138)/(76-99) 130/86 (02/03 0539) SpO2:  [93 %-97 %] 93 % (02/03 0539) Physical Exam: General: 25yo male resting comfortably in no apparent distress CV:  S1 and S2 noted without murmur, regular rate and rhythm Lungs: clear to auscultation bilaterally with normal work of breathing Abdomen: soft, moderately tender near sites of incision without rebound or guarding, normoactive bowel sounds, dressings dry without surrounding erythema or edema Skin: warm, dry, no rashes or lesions, cap refill < 2 seconds Extremities: warm and well perfused, normal tone   Laboratory:  Recent Labs Lab 05/01/16 0337 05/02/16 0330 05/03/16 0249  WBC 14.0* 11.3* 11.1*  HGB 12.5* 11.8* 11.0*  HCT 38.4* 37.6* 35.2*  PLT 394 349 391    Recent Labs Lab 05/01/16 0337 05/02/16 0330 05/03/16 0249  NA 136 140 142  K 3.2* 2.9* 3.8  CL 106 111 111  CO2 20* 20* 23  BUN 11 5* 6  CREATININE 0.89 0.74 0.83  CALCIUM 7.7* 7.4* 7.5*  PROT 5.8* 5.5* 5.2*  BILITOT 3.0* 2.2* 1.5*  ALKPHOS 165* 130* 106  ALT 131* 91* 66*  AST 53* 34 21  GLUCOSE 95 105* 116*   Urinalysis    Component Value Date/Time   COLORURINE AMBER (A) 04/28/2016 0345   APPEARANCEUR HAZY (A) 04/28/2016 0345   LABSPEC 1.016 04/28/2016 0345   PHURINE 6.0 04/28/2016 0345   GLUCOSEU NEGATIVE 04/28/2016 0345   HGBUR MODERATE (A) 04/28/2016 0345   BILIRUBINUR NEGATIVE 04/28/2016 0345   BILIRUBINUR LARGE 04/26/2016 1210   KETONESUR 5 (A) 04/28/2016 0345   PROTEINUR NEGATIVE 04/28/2016 0345   UROBILINOGEN 1.0 04/26/2016 1210   NITRITE POSITIVE (A) 04/28/2016 0345   LEUKOCYTESUR LARGE (A) 04/28/2016 0345   Urine culture: >100,000 GNB Repeat Urine culture: Pending Blood culture (1/28): NGTD Repeat Blood culture (1/31): NGTD Hepatic panel: Negative Lipase: 56  Imaging/Diagnostic Tests: ERCP (04/30/2016) - Choledocholithiasis was found. Complete removal was  accomplished by balloon extraction. - The biliary tree was swept.  US Abdomen Limited (04/28/2016) FINDINGS: Gallbladder: Multiple echogenic stones present within the gallbladder lumen, largest of which measured approximately 1 cm.  Gallbladder wall measure within normal limits at 2 mm. Irregular fluid density present within knee liver adjacent to the gallbladder fossa, better seen on recent CT. No sonographic Murphy sign elicited on exam.  Common bile duct: Diameter: 12 mm. No definite hyperechoic stone seen within the dilated common bile duct.  Liver: Irregular fluid density within the liver adjacent to the gallbladder fossa, better seen on prior CT. Hepatic parenchyma somewhat heterogeneous.  IMPRESSION: 1. Cholelithiasis with dilatation of the common bile duct up to 12 mm. 2. Fluid opacity within the hepatic parenchyma adjacent to the gallbladder fossa, better seen on recent CT. 3. Heterogeneous echotexture of the liver.  CT Abdomen Pelvis W Contrast (04/28/2016) FINDINGS: Lower chest: Small right lung base subpleural density most likely represents atelectatic changes. Infiltrate is less likely but not entirely excluded. Clinical correlation is recommended. Left lung base atelectatic changes noted.  No intra-abdominal free air or free fluid.  Hepatobiliary: The gallbladder is distended. No calcified stone identified. There is noncalcified stone versus sludge within the gallbladder lumen. Noncalcified debris suspected within the midportion of the common bile duct as well as within the central CBD at the head of the pancreas adjacent to the ampulla. There is dilatation of the CBD measuring up to 16 mm. There is also dilatation of the intrahepatic biliary ducts. There is thickened appearance of the gallbladder wall. There is a 4.0 x 1.0 cm inflammatory collection adjacent to the gallbladder in the subhepatic area concerning for an abscess. The liver is unremarkable.  Pancreas: Unremarkable. No pancreatic ductal dilatation or surrounding inflammatory changes.  Spleen: Normal in size without focal abnormality.  Adrenals/Urinary Tract: The adrenal glands appear unremarkable. The right kidney is atrophic. There is  chronic mild dilatation of the right renal collecting system and right ureter. 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 There is mild left hydronephrosis. No obstructing stone identified. Correlation with urinalysis recommended to exclude acute UTI. The urinary bladder is unremarkable.  Stomach/Bowel: There is no evidence of bowel obstruction or active inflammation. Normal appendix.  Vascular/Lymphatic: The abdominal aorta and IVC appear unremarkable. The SMV, splenic vein, and main portal vein are patent. No portal venous gas identified. There is no adenopathy.  Reproductive: The prostate and seminal vesicles are grossly unremarkable.  Other: Right anterior chest wall VP shunt terminates in the subcutaneous soft tissues of the anterior abdominal wall. Right anterior pelvic wall scar. No fluid collection.  Musculoskeletal: Chronic dislocation of the hips. Non fusion of the lumbar posterior elements compatible with spina bifida. There is protrusion and exposure of the CSF field thecal sac compatible with a meningocele. No acute fracture. Postsurgical changes of meningocele repair in the lower back.  IMPRESSION: Dilatation of the intrahepatic and extrahepatic biliary tree as well as distended and inflamed gallbladder likely related to obstruction of the CBD with findings of cholecystitis and a small pericholecystic abscess. Associated acute cholangitis is not entirely excluded. Correlation with clinical exam and further evaluation with MRCP recommended to better evaluate for the gallbladder and CBD contents.  Mild left hydronephrosis. Right renal atrophy with chronic appearing dilatation of the right renal collecting system and ureter. Correlation with urinalysis recommended to exclude acute UTI.  Spina bifida and meningocele.  Chronic bilateral dislocated hips.  DG Chest 2  View (04/27/2016) FINDINGS: Right-sided ventriculoperitoneal shunt with irregularity and discontinuity of the tubing over the right flank. Lungs are somewhat hypoinflated with mild prominence of the perihilar markings. No focal airspace consolidation or effusion. Cardiomediastinal silhouette is within normal. Mild gastric distension.  IMPRESSION: Minimal prominence of the perihilar markings which may be due to the degree of hypoinflation, although cannot exclude viral bronchopneumonia or minimal vascular congestion.  Right-sided ventriculoperitoneal shunt with focal irregularity/ discontinuity of the shunt over the lateral thorax.  [address] SawyerRumley, [place]DO 05/04/2016, 9:07 AM PGY-3, [facility] FPTS Intern pager: [phone], text pages welcome

## 2016-05-04 NOTE — Progress Notes (Signed)
Pt transferred to 5w37 from 4E20 at 2124. Pt's buttocks and groin has severe moisture associated skin damage applied barrier cream. Oriented pt to room. Pt's mom at bedside. Will continue to monitor pt. Nelda MarseilleJenny Thacker, RN

## 2016-05-05 NOTE — Progress Notes (Signed)
Family Medicine Teaching Service Daily Progress Note Intern Pager: [phone]  Patient name: Hector Shea Medical record number: [id number] Date of birth: 1990/08/03 Age: 26 y.o. Gender: male  Primary Care Provider: Uvaldo RisingFLETKE, KYLE, J, MD Consultants: Gastroenterology, general surgery Code Status: Full  Assessment and Plan: 26 y.o. male presenting with worsening abdominal pain. PMH is significant for spina bifida with hydrocephalus, ventricular shunt, and neurogenic bladder with incontinence.  #Ascending cholangitis; POD 4 s/p lap-chole: Acute, improved. S/p ERCP on 1/29 with removal of many stones. Laparoscopic cholecystectomy on 1/31. --Blood cultures NGTD  --General surgery consulted, appreciate recommendations: will remove drain prior to d/c --IV Zosyn per GI recommendations (day 7, 1/28>), vancomycin added due to fever (day 2, 2/2>) and will continue for 5 days following surgery (stop date 2/5), then transition to doxy for a few days upon d/c --OxyIR 5-10 mg q4h PRN --Tylenol 1 g q6h scheduled  #Tachycardia and dyspnea without hypoxia:  Improved. Acute, persists. HR in 110s. Likely secondary to pain secondary to surgery. CTA neg for PE.  --Control pain per above  #Yeast UTI. H/O Neurogenic bladder with incontinence 2/2 spina bifida. In and out cath at home. Urine culture with 80,000 colonies/mL Yeast. --Diflucan  FEN/GI: NS IV @125ml /hr; NPO Prophylaxis: Heparin SQ  Disposition: One more day of IV abx then transition to PO abx. Surgery to remove drain prior to d/c.  Subjective:  Patient says he feels better today. Abdominal pain improved. Denies dyspnea, chest pain, nausea or vomiting.  Objective: Temp:  [98.5 F (36.9 C)-99.9 F (37.7 C)] 98.5 F (36.9 C) (02/04 0413) Pulse Rate:  [82-114] 82 (02/04 0413) Resp:  [18-20] 18 (02/04 0413) BP: (120-139)/(77-85) 120/77 (02/04 0413) SpO2:  [95 %-100 %] 95 % (02/04 0413) Physical Exam: General: 25yo male resting  comfortably in no apparent distress CV: S1 and S2 noted without murmur, regular rate and rhythm Lungs: clear to auscultation bilaterally with normal work of breathing Abdomen: soft, minimally tender near sites of incision without rebound or guarding, normoactive bowel sounds, dressings dry without surrounding erythema or edema Skin: warm, dry, no rashes or lesions, cap refill < 2 seconds Extremities: warm and well perfused, normal tone  Laboratory:  Recent Labs Lab 05/02/16 0330 05/03/16 0249 05/04/16 1141  WBC 11.3* 11.1* 11.9*  HGB 11.8* 11.0* 11.4*  HCT 37.6* 35.2* 35.5*  PLT 349 391 510*    Recent Labs Lab 05/02/16 0330 05/03/16 0249 05/04/16 1141  NA 140 142 141  K 2.9* 3.8 3.6  CL 111 111 105  CO2 20* 23 26  BUN 5* 6 6  CREATININE 0.74 0.83 0.80  CALCIUM 7.4* 7.5* 8.2*  PROT 5.5* 5.2* 5.4*  BILITOT 2.2* 1.5* 1.6*  ALKPHOS 130* 106 102  ALT 91* 66* 48  AST 34 21 18  GLUCOSE 105* 116* 130*   Urinalysis    Component Value Date/Time   COLORURINE AMBER (A) 04/28/2016 0345   APPEARANCEUR HAZY (A) 04/28/2016 0345   LABSPEC 1.016 04/28/2016 0345   PHURINE 6.0 04/28/2016 0345   GLUCOSEU NEGATIVE 04/28/2016 0345   HGBUR MODERATE (A) 04/28/2016 0345   BILIRUBINUR NEGATIVE 04/28/2016 0345   BILIRUBINUR LARGE 04/26/2016 1210   KETONESUR 5 (A) 04/28/2016 0345   PROTEINUR NEGATIVE 04/28/2016 0345   UROBILINOGEN 1.0 04/26/2016 1210   NITRITE POSITIVE (A) 04/28/2016 0345   LEUKOCYTESUR LARGE (A) 04/28/2016 0345   Urine culture: >100,000 GNB Repeat Urine culture: 80,000 yeast Blood culture (1/28): NGTD Repeat Blood culture (1/31): NGTD Hepatic panel: Negative  Lipase: 56  Imaging/Diagnostic Tests: ERCP (04/30/2016) - Choledocholithiasis was found. Complete removal was accomplished by balloon extraction. - The biliary tree was swept.  US Abdomen Limited (04/28/2016) FINDINGS: Gallbladder: Multiple echogenic stones present within the gallbladder lumen, largest  of which measured approximately 1 cm. Gallbladder wall measure within normal limits at 2 mm. Irregular fluid density present within knee liver adjacent to the gallbladder fossa, better seen on recent CT. No sonographic Murphy sign elicited on exam.  Common bile duct: Diameter: 12 mm. No definite hyperechoic stone seen within the dilated common bile duct.  Liver: Irregular fluid density within the liver adjacent to the gallbladder fossa, better seen on prior CT. Hepatic parenchyma somewhat heterogeneous.  IMPRESSION: 1. Cholelithiasis with dilatation of the common bile duct up to 12 mm. 2. Fluid opacity within the hepatic parenchyma adjacent to the gallbladder fossa, better seen on recent CT. 3. Heterogeneous echotexture of the liver.  CT Abdomen Pelvis W Contrast (04/28/2016) FINDINGS: Lower chest: Small right lung base subpleural density most likely represents atelectatic changes. Infiltrate is less likely but not entirely excluded. Clinical correlation is recommended. Left lung base atelectatic changes noted.  No intra-abdominal free air or free fluid.  Hepatobiliary: The gallbladder is distended. No calcified stone identified. There is noncalcified stone versus sludge within the gallbladder lumen. Noncalcified debris suspected within the midportion of the common bile duct as well as within the central CBD at the head of the pancreas adjacent to the ampulla. There is dilatation of the CBD measuring up to 16 mm. There is also dilatation of the intrahepatic biliary ducts. There is thickened appearance of the gallbladder wall. There is a 4.0 x 1.0 cm inflammatory collection adjacent to the gallbladder in the subhepatic area concerning for an abscess. The liver is unremarkable.  Pancreas: Unremarkable. No pancreatic ductal dilatation or surrounding inflammatory changes.  Spleen: Normal in size without focal abnormality.  Adrenals/Urinary Tract: The adrenal glands appear unremarkable. The  right kidney is atrophic. There is chronic mild dilatation of the right renal collecting system and right ureter. 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 There is mild left hydronephrosis. No obstructing stone identified. Correlation with urinalysis recommended to exclude acute UTI. The urinary bladder is unremarkable.  Stomach/Bowel: There is no evidence of bowel obstruction or active inflammation. Normal appendix.  Vascular/Lymphatic: The abdominal aorta and IVC appear unremarkable. The SMV, splenic vein, and main portal vein are patent. No portal venous gas identified. There is no adenopathy.  Reproductive: The prostate and seminal vesicles are grossly unremarkable.  Other: Right anterior chest wall VP shunt terminates in the subcutaneous soft tissues of the anterior abdominal wall. Right anterior pelvic wall scar. No fluid collection.  Musculoskeletal: Chronic dislocation of the hips. Non fusion of the lumbar posterior elements compatible with spina bifida. There is protrusion and exposure of the CSF field thecal sac compatible with a meningocele. No acute fracture. Postsurgical changes of meningocele repair in the lower back.  IMPRESSION: Dilatation of the intrahepatic and extrahepatic biliary tree as well as distended and inflamed gallbladder likely related to obstruction of the CBD with findings of cholecystitis and a small pericholecystic abscess. Associated acute cholangitis is not entirely excluded. Correlation with clinical exam and further evaluation with MRCP recommended to better evaluate for the gallbladder and CBD contents.  Mild left hydronephrosis. Right renal atrophy with chronic appearing dilatation of the right renal collecting system and  ureter. Correlation with urinalysis recommended to exclude acute UTI.  Spina bifida and meningocele.  Chronic  bilateral dislocated hips.  DG Chest 2 View (04/27/2016) FINDINGS: Right-sided ventriculoperitoneal shunt with irregularity and discontinuity of the tubing over the right flank. Lungs are somewhat hypoinflated with mild prominence of the perihilar markings. No focal airspace consolidation or effusion. Cardiomediastinal silhouette is within normal. Mild gastric distension.  IMPRESSION: Minimal prominence of the perihilar markings which may be due to the degree of hypoinflation, although cannot exclude viral bronchopneumonia or minimal vascular congestion.  Right-sided ventriculoperitoneal shunt with focal irregularity/ discontinuity of the shunt over the lateral thorax.    Wendee Beavers, DO 05/05/2016, 7:13 AM PGY-1, [facility] FPTS Intern pager: [phone], text pages welcome

## 2016-05-05 NOTE — Progress Notes (Signed)
5 Days Post-Op  Subjective: Comfortable Tolerating po  Objective: Vital signs in last 24 hours: Temp:  [98.5 F (36.9 C)-99.9 F (37.7 C)] 98.5 F (36.9 C) (02/04 0413) Pulse Rate:  [82-114] 82 (02/04 0413) Resp:  [18-20] 18 (02/04 0413) BP: (120-139)/(77-85) 120/77 (02/04 0413) SpO2:  [95 %-100 %] 95 % (02/04 0413) Last BM Date: 05/03/16  Intake/Output from previous day: 02/03 0701 - 02/04 0700 In: 1350 [P.O.:100; I.V.:475; IV Piggyback:750] Out: 5470 [Urine:5450; Drains:20] Intake/Output this shift: No intake/output data recorded.  Exam: Well in appearance Abdomen soft Drain serous  Lab Results:   Recent Labs  05/03/16 0249 05/04/16 1141  WBC 11.1* 11.9*  HGB 11.0* 11.4*  HCT 35.2* 35.5*  PLT 391 510*   BMET  Recent Labs  05/03/16 0249 05/04/16 1141  NA 142 141  K 3.8 3.6  CL 111 105  CO2 23 26  GLUCOSE 116* 130*  BUN 6 6  CREATININE 0.83 0.80  CALCIUM 7.5* 8.2*   PT/INR No results for input(s): LABPROT, INR in the last 72 hours. ABG No results for input(s): PHART, HCO3 in the last 72 hours.  Invalid input(s): PCO2, PO2  Studies/Results: No results found.  Anti-infectives: Anti-infectives    Start     Dose/Rate Route Frequency Ordered Stop   05/03/16 1400  piperacillin-tazobactam (ZOSYN) IVPB 3.375 g     3.375 g 12.5 mL/hr over 240 Minutes Intravenous Every 8 hours 05/03/16 1111 05/06/16 1359   05/03/16 1000  vancomycin (VANCOCIN) IVPB 1000 mg/200 mL premix     1,000 mg 200 mL/hr over 60 Minutes Intravenous Every 8 hours 05/03/16 0221     05/03/16 1000  fluconazole (DIFLUCAN) tablet 200 mg     200 mg Oral Daily 05/03/16 0952     05/03/16 0300  vancomycin (VANCOCIN) 1,250 mg in sodium chloride 0.9 % 250 mL IVPB     1,250 mg 166.7 mL/hr over 90 Minutes Intravenous  Once 05/03/16 0221 05/03/16 0536   05/01/16 1400  piperacillin-tazobactam (ZOSYN) IVPB 3.375 g     3.375 g 12.5 mL/hr over 240 Minutes Intravenous Every 8 hours 05/01/16 1356  05/03/16 0948   04/30/16 1830  piperacillin-tazobactam (ZOSYN) IVPB 3.375 g     3.375 g 12.5 mL/hr over 240 Minutes Intravenous Every 8 hours 04/30/16 1457 05/01/16 0604   04/28/16 1400  piperacillin-tazobactam (ZOSYN) IVPB 3.375 g  Status:  Discontinued     3.375 g 100 mL/hr over 30 Minutes Intravenous Every 8 hours 04/28/16 1006 04/28/16 1007   04/28/16 1030  piperacillin-tazobactam (ZOSYN) IVPB 3.375 g  Status:  Discontinued     3.375 g 12.5 mL/hr over 240 Minutes Intravenous Every 8 hours 04/28/16 1008 04/30/16 1457   04/28/16 0730  ciprofloxacin (CIPRO) IVPB 400 mg  Status:  Discontinued     400 mg 200 mL/hr over 60 Minutes Intravenous Every 12 hours 04/28/16 0657 04/28/16 1006   04/28/16 0215  vancomycin (VANCOCIN) IVPB 1000 mg/200 mL premix     1,000 mg 200 mL/hr over 60 Minutes Intravenous  Once 04/28/16 0211 04/28/16 0451   04/28/16 0215  piperacillin-tazobactam (ZOSYN) IVPB 3.375 g     3.375 g 100 mL/hr over 30 Minutes Intravenous  Once 04/28/16 0211 04/28/16 0317      Assessment/Plan: s/p Procedure(s): LAPAROSCOPIC CHOLECYSTECTOMY (N/A)  Continuing IV antibiotics until tomorrow Will remove drain at discharge  LOS: 7 days    Hector Shea A 05/05/2016

## 2016-05-06 LAB — CBC
HCT: 37.4 % — ABNORMAL LOW (ref 39.0–52.0)
HEMOGLOBIN: 12 g/dL — AB (ref 13.0–17.0)
MCH: 29.5 pg (ref 26.0–34.0)
MCHC: 32.1 g/dL (ref 30.0–36.0)
MCV: 91.9 fL (ref 78.0–100.0)
PLATELETS: 633 10*3/uL — AB (ref 150–400)
RBC: 4.07 MIL/uL — AB (ref 4.22–5.81)
RDW: 15.7 % — ABNORMAL HIGH (ref 11.5–15.5)
WBC: 9.9 10*3/uL (ref 4.0–10.5)

## 2016-05-06 LAB — COMPREHENSIVE METABOLIC PANEL
ALBUMIN: 2.2 g/dL — AB (ref 3.5–5.0)
ALK PHOS: 96 U/L (ref 38–126)
ALT: 36 U/L (ref 17–63)
AST: 20 U/L (ref 15–41)
Anion gap: 10 (ref 5–15)
BUN: 9 mg/dL (ref 6–20)
CALCIUM: 8.6 mg/dL — AB (ref 8.9–10.3)
CHLORIDE: 103 mmol/L (ref 101–111)
CO2: 26 mmol/L (ref 22–32)
CREATININE: 1.01 mg/dL (ref 0.61–1.24)
GFR calc Af Amer: >60 mL/min (ref >60)
GFR calc non Af Amer: >60 mL/min (ref >60)
GLUCOSE: 108 mg/dL — AB (ref 65–99)
Potassium: 3.6 mmol/L (ref 3.5–5.1)
SODIUM: 139 mmol/L (ref 135–145)
Total Bilirubin: 1.5 mg/dL — ABNORMAL HIGH (ref 0.3–1.2)
Total Protein: 6.1 g/dL — ABNORMAL LOW (ref 6.5–8.1)

## 2016-05-06 LAB — CULTURE, BLOOD (ROUTINE X 2)
CULTURE: NO GROWTH
Culture: NO GROWTH

## 2016-05-06 MED ORDER — DOXYCYCLINE HYCLATE 100 MG PO CAPS: 100.0000 mg | ORAL_CAPSULE | Freq: Two times a day (BID) | ORAL | 0 refills | Status: AC

## 2016-05-06 MED ORDER — POLYETHYLENE GLYCOL 3350 17 G PO PACK
17.0000 g | PACK | Freq: Every day | ORAL | 0 refills | Status: DC
Start: 2016-05-07 — End: 2020-03-17

## 2016-05-06 MED ORDER — DOXYCYCLINE HYCLATE 100 MG PO TABS
100.0000 mg | ORAL_TABLET | Freq: Two times a day (BID) | ORAL | Status: DC
  Administered 2016-05-06: 100 mg via ORAL
  Filled 2016-05-06: qty 1

## 2016-05-06 NOTE — Progress Notes (Signed)
6 Days Post-Op  Subjective: Comfortable Tolerating po, denies pain  Objective: Vital signs in last 24 hours: Temp:  [98.2 F (36.8 C)-98.5 F (36.9 C)] 98.5 F (36.9 C) (02/05 0405) Pulse Rate:  [91-101] 91 (02/05 0405) Resp:  [18-20] 18 (02/05 0405) BP: (112-129)/(71-82) 112/72 (02/05 0405) SpO2:  [93 %-97 %] 95 % (02/05 0405) Last BM Date: 05/05/16  Intake/Output from previous day: 02/04 0701 - 02/05 0700 In: 1228.2 [P.O.:240; I.V.:238.2; IV Piggyback:750] Out: 2305 [Urine:2275; Drains:30] Intake/Output this shift: Total I/O In: 200 [IV Piggyback:200] Out: 715 [Urine:700; Drains:15]  Exam: Well in appearance Abdomen soft, incisions c/d/i Drain serosanguinous, output has been 30 or less last 3 days  Lab Results:   Recent Labs  05/04/16 1141 05/06/16 0459  WBC 11.9* 9.9  HGB 11.4* 12.0*  HCT 35.5* 37.4*  PLT 510* 633*   BMET  Recent Labs  05/04/16 1141 05/06/16 0459  NA 141 139  K 3.6 3.6  CL 105 103  CO2 26 26  GLUCOSE 130* 108*  BUN 6 9  CREATININE 0.80 1.01  CALCIUM 8.2* 8.6*   PT/INR No results for input(s): LABPROT, INR in the last 72 hours. ABG No results for input(s): PHART, HCO3 in the last 72 hours.  Invalid input(s): PCO2, PO2  Studies/Results: No results found.  Anti-infectives: Anti-infectives    Start     Dose/Rate Route Frequency Ordered Stop   05/03/16 1400  piperacillin-tazobactam (ZOSYN) IVPB 3.375 g     3.375 g 12.5 mL/hr over 240 Minutes Intravenous Every 8 hours 05/03/16 1111 05/06/16 0953   05/03/16 1000  vancomycin (VANCOCIN) IVPB 1000 mg/200 mL premix     1,000 mg 200 mL/hr over 60 Minutes Intravenous Every 8 hours 05/03/16 0221     05/03/16 1000  fluconazole (DIFLUCAN) tablet 200 mg  Status:  Discontinued     200 mg Oral Daily 05/03/16 0952 05/06/16 1032   05/03/16 0300  vancomycin (VANCOCIN) 1,250 mg in sodium chloride 0.9 % 250 mL IVPB     1,250 mg 166.7 mL/hr over 90 Minutes Intravenous  Once 05/03/16 0221  05/03/16 0536   05/01/16 1400  piperacillin-tazobactam (ZOSYN) IVPB 3.375 g     3.375 g 12.5 mL/hr over 240 Minutes Intravenous Every 8 hours 05/01/16 1356 05/03/16 0948   04/30/16 1830  piperacillin-tazobactam (ZOSYN) IVPB 3.375 g     3.375 g 12.5 mL/hr over 240 Minutes Intravenous Every 8 hours 04/30/16 1457 05/01/16 0604   04/28/16 1400  piperacillin-tazobactam (ZOSYN) IVPB 3.375 g  Status:  Discontinued     3.375 g 100 mL/hr over 30 Minutes Intravenous Every 8 hours 04/28/16 1006 04/28/16 1007   04/28/16 1030  piperacillin-tazobactam (ZOSYN) IVPB 3.375 g  Status:  Discontinued     3.375 g 12.5 mL/hr over 240 Minutes Intravenous Every 8 hours 04/28/16 1008 04/30/16 1457   04/28/16 0730  ciprofloxacin (CIPRO) IVPB 400 mg  Status:  Discontinued     400 mg 200 mL/hr over 60 Minutes Intravenous Every 12 hours 04/28/16 0657 04/28/16 1006   04/28/16 0215  vancomycin (VANCOCIN) IVPB 1000 mg/200 mL premix     1,000 mg 200 mL/hr over 60 Minutes Intravenous  Once 04/28/16 0211 04/28/16 0451   04/28/16 0215  piperacillin-tazobactam (ZOSYN) IVPB 3.375 g     3.375 g 100 mL/hr over 30 Minutes Intravenous  Once 04/28/16 0211 04/28/16 0317      Assessment/Plan: s/p Procedure(s): LAPAROSCOPIC CHOLECYSTECTOMY (N/A)  OK for discharge today from surgical standpoint- follow up with Dr.  Wilson in 2 weeks DC JP drain  LOS: 8 days    Berna Bue 05/06/2016

## 2016-05-06 NOTE — Care Management Important Message (Signed)
Important Message  Patient Details  Name: Hector Shea MRN: [id number] Date of Birth: 1991/02/09   Medicare Important Message Given:  Yes    Drusilla Wampole Stefan ChurchBratton 05/06/2016, 4:47 PM

## 2016-05-06 NOTE — Discharge Instructions (Signed)
Usted ingres por una complicacin de su vescula biliar que requiri [place]. Podemos eliminar su vescula biliar para resolver el problema. Usted requiri antibiticos durante su ingreso. Continuar con [employer] al da durante un total de 5 das ms. Tambin le dar unos [address]  [address] de medicamentos para el dolor que puede tomar si es necesario. Junto con esto, habr medicamento para ayudar con las deposiciones. l har un seguimiento con PCP el 05/09/2016 a las 2:30 PM. Harlene Ramus un seguimiento con Yevette Edwards 20/05/2016 a las 10:30 a.m. Vea a continuacin para obtener informacin sobre su oficina.  CCS ______[facility] LAPAROSCOPIC SURGERY: POST OP INSTRUCTIONS Always review your discharge instruction sheet given to you by the facility where your surgery was performed. IF YOU HAVE DISABILITY OR FAMILY LEAVE FORMS, YOU MUST BRING THEM TO THE OFFICE FOR PROCESSING.   DO NOT GIVE THEM TO YOUR DOCTOR.  1. A prescription for pain medication may be given to you upon discharge.  Take your pain medication as prescribed, if needed.  If narcotic pain medicine is not needed, then you may take acetaminophen (Tylenol) or ibuprofen (Advil) as needed. 2. Take your usually prescribed medications unless otherwise directed. 3. If you need a refill on your pain medication, please contact your pharmacy.  They will contact our office to request authorization. Prescriptions will not be filled after 5pm or on week-ends. 4. You should follow a light diet the first few days after arrival home, such as soup and crackers, etc.  Be sure to include lots of fluids daily. 5. Most patients will experience some swelling and bruising in the area of the incisions.  Ice packs will help.  Swelling and bruising can take several days to resolve.  6. It is common to experience some constipation if taking pain medication after surgery.  Increasing fluid intake and taking a stool softener (such as Colace) will  usually help or prevent this problem from occurring.  A mild laxative (Milk of Magnesia or Miralax) should be taken according to package instructions if there are no bowel movements after 48 hours. 7. Unless discharge instructions indicate otherwise, you may remove your bandages 24-48 hours after surgery, and you may shower at that time.  You may have steri-strips (small skin tapes) in place directly over the incision.  These strips should be left on the skin for 7-10 days.  If your surgeon used skin glue on the incision, you may shower in 24 hours.  The glue will flake off over the next 2-3 weeks.  Any sutures or staples will be removed at the office during your follow-up visit. 8. ACTIVITIES:  You may resume regular (light) daily activities beginning the next day--such as daily self-care, walking, climbing stairs--gradually increasing activities as tolerated.  You may have sexual intercourse when it is comfortable.  Refrain from any heavy lifting or straining until approved by your doctor. a. You may drive when you are no longer taking prescription pain medication, you can comfortably wear a seatbelt, and you can safely maneuver your car and apply brakes. b. RETURN TO WORK:  __________________________________________________________ 9. You should see your doctor in the office for a follow-up appointment approximately 2-3 weeks after your surgery.  Make sure that you call for this appointment within a day or two after you arrive home to insure a convenient appointment time. 10. OTHER INSTRUCTIONS: __________________________________________________________________________________________________________________________ __________________________________________________________________________________________________________________________ WHEN TO CALL YOUR DOCTOR: 1. Fever over 101.0 2. Inability to urinate 3. Continued bleeding from incision. 4. Increased pain,  redness, or drainage from the  incision. 5. Increasing abdominal pain  The clinic staff is available to answer your questions during regular business hours.  Please dont hesitate to call and ask to speak to one of the nurses for clinical concerns.  If you have a medical emergency, go to the nearest emergency room or call 911.  A surgeon from [facility] is always on call at the hospital. [address], [address], [place], [place]  [id number] ? [address] [address], [place], [place]   [id number] [phone] ? [phone] ? FAX [phone] Web site: [email or web address]    Colecistostoma, cuidados posteriores (Cholecystostomy, Care After) Siga estas instrucciones durante las prximas semanas. Estas indicaciones le proporcionan informacin acerca de cmo deber cuidarse despus del procedimiento. El mdico tambin podr darle instrucciones ms especficas. El tratamiento ha sido planificado segn las prcticas mdicas actuales, pero en algunos casos pueden ocurrir problemas. Comunquese con el mdico si tiene algn problema o dudas despus del procedimiento. QU ESPERAR DESPUS DEL PROCEDIMIENTO Despus del procedimiento, es comn sentir dolor cerca del lugar del tubo de drenaje (catter) o de la incisin. INSTRUCCIONES PARA EL CUIDADO EN EL HOGAR Cuidado de la incisin   Siga las indicaciones del mdico acerca del cuidado de la incisin. Haga lo siguiente:  BorgWarnerLvese las manos con agua y jabn antes de Multimedia programmercambiar las vendas (vendaje). Use desinfectante para manos si no dispone de [place] y [place].  Cambie el vendaje como se lo haya indicado el mdico.  No retire los puntos (suturas), el QUALCOMMadhesivo para la piel o las tiras [place]adhesivas. Es posible que estos deban quedar puestos en la piel durante 2semanas o ms tiempo. Si los bordes de las tiras [address] empiezan a despegarse y Scientific laboratory technicianenroscarse, puede recortar los que estn sueltos. No retire las tiras Agilent Technologiesadhesivas por completo a menos que el mdico se lo  indique.  Controle Immunologistel lugar de la incisin y del drenaje todos los [address]  [address] para detectar signos de infeccin. Est atento a lo siguiente:  Dolor, hinchazn o enrojecimiento.  Lquido, sangre o pus. Instrucciones generales   Si lo enviaron a su casa con un drenaje quirrgico colocado, siga las indicaciones del mdico sobre cmo cuidarlo y cmo cuidar la bolsa de Production managerrecoleccin.  No tome baos de inmersin, no nade ni use el jacuzzi hasta que el mdico lo autorice. Pregntele al mdico si puede ducharse. Delle Reiningal vez solo le permitan tomar baos de [place].  Siga las indicaciones del mdico respecto de lo que puede comer o beber.  Tome los medicamentos de venta libre y los recetados solamente como se lo haya indicado el mdico.  OceanographerConcurra a todas las visitas de control como se lo haya indicado el mdico. Esto es importante. SOLICITE ATENCIN MDICA SI:  Hay enrojecimiento, hinchazn o dolor en el lugar de la incisin o del drenaje.  Tiene nuseas o vmitos. SOLICITE ATENCIN MDICA DE INMEDIATO SI:  El dolor abdominal empeora.  Siente mareos o se Emerson Electricdesmaya mientras est de pie.  Observa lquido, sangre o pus que emanan del lugar de la incisin o del drenaje.  Tiene fiebre.  Le falta el aire.  Tiene una frecuencia cardaca acelerada.  Los vmitos o las nuseas no desaparecen.  El tubo de Fosterdrenaje se obstruye.  El tubo de Copelanddrenaje se sale del abdomen. Esta informacin no tiene Theme park managercomo fin reemplazar el consejo del mdico. Asegrese de hacerle al mdico cualquier pregunta que tenga. Document Released: 12/07/2014 Document Revised: 12/07/2014 Document Reviewed: 06/29/2014 Elsevier Interactive Patient Education  2017 [place].

## 2016-05-06 NOTE — Progress Notes (Signed)
Family Medicine Teaching Service Daily Progress Note Intern Pager: [phone]  Patient name: Hector Shea Medical record number: [id number] Date of birth: 1990/07/25 Age: 26 y.o. Gender: male  Primary Care Provider: Uvaldo RisingFLETKE, KYLE, J, MD Consultants: Gastroenterology, general surgery Code Status: Full  Assessment and Plan: 26 y.o. male presenting with worsening abdominal pain. PMH is significant for spina bifida with hydrocephalus, ventricular shunt, and neurogenic bladder with incontinence.  #Ascending cholangitis; POD 4 s/p lap-chole: Acute, improved. S/p ERCP on 1/29 with removal of many stones. Laparoscopic cholecystectomy on 1/31. --Blood cultures NGTD  --General surgery consulted, appreciate recommendations: will remove drain prior to d/c --Completed IV zosyn (1/28-2/5)and vancomycin (2/2-2/5), will transition to doxy for a few days upon d/c for MRSA coverage --OxyIR 5-10 mg q4h PRN --Tylenol 1 g q6h scheduled  #Tachycardia and dyspnea without hypoxia:  Acute, resolved. Likely secondary to pain secondary to surgery. CTA neg for PE. Leukocytosis resolved. Hemoglobin stable. --Control pain per above  #Yeast UTI. H/O Neurogenic bladder with incontinence 2/2 spina bifida. In and out cath at home. Urine culture with 80,000 colonies/mL Yeast. --Diflucan  FEN/GI: Clear liquid diet Prophylaxis: Heparin SQ  Disposition: Will transition to PO abx today. Surgery to remove drain prior to d/c.  Subjective:  Pacific interpreters used: Adam [phone]. No complaints today. Pain continues to imporve. No longer SOB. W/o CP or N/V. Last bowel movement yesterday.  Objective: Temp:  [98.2 F (36.8 C)-98.5 F (36.9 C)] 98.5 F (36.9 C) (02/05 0405) Pulse Rate:  [91-101] 91 (02/05 0405) Resp:  [18-20] 18 (02/05 0405) BP: (112-129)/(71-82) 112/72 (02/05 0405) SpO2:  [93 %-97 %] 95 % (02/05 0405) Physical Exam: General: 25yo male resting comfortably in no apparent distress CV: S1  and S2 noted without murmur, regular rate and rhythm Lungs: clear to auscultation bilaterally with normal work of breathing Abdomen: soft, minimally tender near sites of incision without rebound or guarding, normoactive bowel sounds, dressings dry without surrounding erythema or edema Skin: warm, dry, no rashes or lesions, cap refill < 2 seconds Extremities: warm and well perfused, normal tone   Laboratory:  Recent Labs Lab 05/03/16 0249 05/04/16 1141 05/06/16 0459  WBC 11.1* 11.9* 9.9  HGB 11.0* 11.4* 12.0*  HCT 35.2* 35.5* 37.4*  PLT 391 510* 633*    Recent Labs Lab 05/03/16 0249 05/04/16 1141 05/06/16 0459  NA 142 141 139  K 3.8 3.6 3.6  CL 111 105 103  CO2 23 26 26   BUN 6 6 9   CREATININE 0.83 0.80 1.01  CALCIUM 7.5* 8.2* 8.6*  PROT 5.2* 5.4* 6.1*  BILITOT 1.5* 1.6* 1.5*  ALKPHOS 106 102 96  ALT 66* 48 36  AST 21 18 20   GLUCOSE 116* 130* 108*   Urinalysis    Component Value Date/Time   COLORURINE AMBER (A) 04/28/2016 0345   APPEARANCEUR HAZY (A) 04/28/2016 0345   LABSPEC 1.016 04/28/2016 0345   PHURINE 6.0 04/28/2016 0345   GLUCOSEU NEGATIVE 04/28/2016 0345   HGBUR MODERATE (A) 04/28/2016 0345   BILIRUBINUR NEGATIVE 04/28/2016 0345   BILIRUBINUR LARGE 04/26/2016 1210   KETONESUR 5 (A) 04/28/2016 0345   PROTEINUR NEGATIVE 04/28/2016 0345   UROBILINOGEN 1.0 04/26/2016 1210   NITRITE POSITIVE (A) 04/28/2016 0345   LEUKOCYTESUR LARGE (A) 04/28/2016 0345   Urine culture: >100,000 GNB Repeat Urine culture: 80,000 yeast Blood culture (1/28): NGTD Repeat Blood culture (1/31): NGTD Hepatic panel: Negative Lipase: 56  Imaging/Diagnostic Tests: ERCP (04/30/2016) - Choledocholithiasis was found. Complete removal was accomplished by balloon  extraction. - The biliary tree was swept.  US Abdomen Limited (04/28/2016) FINDINGS: Gallbladder: Multiple echogenic stones present within the gallbladder lumen, largest of which measured approximately 1 cm. Gallbladder  wall measure within normal limits at 2 mm. Irregular fluid density present within knee liver adjacent to the gallbladder fossa, better seen on recent CT. No sonographic Murphy sign elicited on exam.  Common bile duct: Diameter: 12 mm. No definite hyperechoic stone seen within the dilated common bile duct.  Liver: Irregular fluid density within the liver adjacent to the gallbladder fossa, better seen on prior CT. Hepatic parenchyma somewhat heterogeneous.  IMPRESSION: 1. Cholelithiasis with dilatation of the common bile duct up to 12 mm. 2. Fluid opacity within the hepatic parenchyma adjacent to the gallbladder fossa, better seen on recent CT. 3. Heterogeneous echotexture of the liver.  CT Abdomen Pelvis W Contrast (04/28/2016) FINDINGS: Lower chest: Small right lung base subpleural density most likely represents atelectatic changes. Infiltrate is less likely but not entirely excluded. Clinical correlation is recommended. Left lung base atelectatic changes noted.  No intra-abdominal free air or free fluid.  Hepatobiliary: The gallbladder is distended. No calcified stone identified. There is noncalcified stone versus sludge within the gallbladder lumen. Noncalcified debris suspected within the midportion of the common bile duct as well as within the central CBD at the head of the pancreas adjacent to the ampulla. There is dilatation of the CBD measuring up to 16 mm. There is also dilatation of the intrahepatic biliary ducts. There is thickened appearance of the gallbladder wall. There is a 4.0 x 1.0 cm inflammatory collection adjacent to the gallbladder in the subhepatic area concerning for an abscess. The liver is unremarkable.  Pancreas: Unremarkable. No pancreatic ductal dilatation or surrounding inflammatory changes.  Spleen: Normal in size without focal abnormality.  Adrenals/Urinary Tract: The adrenal glands appear unremarkable. The right kidney is atrophic. There is chronic mild  dilatation of the right renal collecting system and right ureter. 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 There is mild left hydronephrosis. No obstructing stone identified. Correlation with urinalysis recommended to exclude acute UTI. The urinary bladder is unremarkable.  Stomach/Bowel: There is no evidence of bowel obstruction or active inflammation. Normal appendix.  Vascular/Lymphatic: The abdominal aorta and IVC appear unremarkable. The SMV, splenic vein, and main portal vein are patent. No portal venous gas identified. There is no adenopathy.  Reproductive: The prostate and seminal vesicles are grossly unremarkable.  Other: Right anterior chest wall VP shunt terminates in the subcutaneous soft tissues of the anterior abdominal wall. Right anterior pelvic wall scar. No fluid collection.  Musculoskeletal: Chronic dislocation of the hips. Non fusion of the lumbar posterior elements compatible with spina bifida. There is protrusion and exposure of the CSF field thecal sac compatible with a meningocele. No acute fracture. Postsurgical changes of meningocele repair in the lower back.  IMPRESSION: Dilatation of the intrahepatic and extrahepatic biliary tree as well as distended and inflamed gallbladder likely related to obstruction of the CBD with findings of cholecystitis and a small pericholecystic abscess. Associated acute cholangitis is not entirely excluded. Correlation with clinical exam and further evaluation with MRCP recommended to better evaluate for the gallbladder and CBD contents.  Mild left hydronephrosis. Right renal atrophy with chronic appearing dilatation of the right renal collecting system and ureter. Correlation with urinalysis recommended to exclude acute UTI.  Spina bifida and meningocele.  Chronic bilateral  dislocated hips.  DG Chest 2 View  (04/27/2016) FINDINGS: Right-sided ventriculoperitoneal shunt with irregularity and discontinuity of the tubing over the right flank. Lungs are somewhat hypoinflated with mild prominence of the perihilar markings. No focal airspace consolidation or effusion. Cardiomediastinal silhouette is within normal. Mild gastric distension.  IMPRESSION: Minimal prominence of the perihilar markings which may be due to the degree of hypoinflation, although cannot exclude viral bronchopneumonia or minimal vascular congestion.  Right-sided ventriculoperitoneal shunt with focal irregularity/ discontinuity of the shunt over the lateral thorax.    Wendee Beavers, DO 05/06/2016, 6:58 AM PGY-1, [facility] FPTS Intern pager: [phone], text pages welcome

## 2016-05-06 NOTE — Care Management Note (Signed)
Case Management Note  Patient Details  Name: Hector NeedyJuan Manuel Varela Shea MRN: [id number] Date of Birth: [birth year]  Subjective/Objective:                 Patient will DC to home with JP drain. No CM needs identified at this time. Nursing staff to teach drain care to patient/ family prior to discharge.    Action/Plan:  No CM needs identified at this time. Expected Discharge Date:  05/06/16               Expected Discharge Plan:  Home/Self Care  In-House Referral:     Discharge planning Services  CM Consult  Post Acute Care Choice:    Choice offered to:     DME Arranged:    DME Agency:     HH Arranged:    HH Agency:     Status of Service:  Completed, signed off  If discussed at MicrosoftLong Length of Stay Meetings, dates discussed:    Additional Comments:  Lawerance SabalDebbie Yunus [place], RN 05/06/2016, 2:39 PM

## 2016-05-08 ENCOUNTER — Telehealth: Payer: Self-pay | Admitting: *Deleted

## 2016-05-08 NOTE — Telephone Encounter (Signed)
Transitional Care Post-Discharge Follow-Up Phone Call:   Admit date: 04/28/2016 Discharge date: 05/06/2016  Discharge Disposition: Home  Best patient contact number: [phone] Emergency contact(s): Leandrew KoyanagiFrancisco Varela [phone] PCP: Randolm IdolFletke  Principal Discharge Diagnosis: Ascending cholangitis  Reason for Chronic Case Management: Co-morbidities as follows:  S/P Lap/Chole, Spina-Bifida with Hydrocephalus,Neurogenic Bladder with incontinence  Post-discharge Communication:   Call Completed: Yes, with patient   Interpreter Needed: Yes. WellPoint[employer] Interpreter, [place]. ID # [id number]   Please check all that apply:  X Patient is caring for self at home.  ? Patient has caregiver. If so, name and best contact number:  X Patient is knowledgeable of his/her condition(s) and/or treatment.  ? Family and/or caregiver is knowledgeable of patient's condition(s) and/or treatment.   Medication Reconciliation:  X Medication list reviewed with patient.  X Patient has all discharge medications Patient uses [facility] on [address]. Has all d/c meds. Was able to state the correct  frequency of doxycycline and oxybutynin. Has had some diarrhea so instructed to hold off on miralax for now.  ? Patient has O2/CPAP ordered? If so, name of company that supplies:  Activities of Daily Living:  X Independent  ? Needs assist (describe)  ? Total Care (describe)   Community resources in place for patient:  X None Patient does not feel community resources would benefit him at this time. ? Home Health If so, name of agency: ? [facility] If so, name of Care Manager and contact number:   ? Assisted Living  ? Hospice  ? Support Group    Topics discussed:  Home Environment:Patient lives in trailer with ramp with parents and uncle  Support System: Family as above  Home DME: Ordered at discharge? Does patient have? None  Transportation: Barriers? Patient has a truck and states he will have no difficulty coming to  appts  Food/Nutrition: Denies food insecurity  Would patient benefit from consult with Social Work? Behavioral Health? Pharm D? Not at this time  Identified Barriers: Language  Topics Discussed: Patient states he's been doing, "Fine, thank God" since hosp d/c. Denies SHOB, racing heart. States urine has been "yellow, normal", has had BM and a "little diarrhea" since home. Instructed to hold miralax while having diarrhea.   Patient Education: Patient asked what he should be eating and drinking at this time. Discussed instructions on discharge paperwork to eat light meals (soup, crackers) and drink plenty of fluids. Patient has been drinking straight apple juice and OJ since discharge. Recommended diluting these with water and alternating with water alone to decrease sugar consumption. Patient states he will. (Urine cx showed yeast)  Reminded of TOC appt on 05/09/2016 at 2:30  with Dr.Gonfa. Patient states he also has f/u with surgeon on 05/21/16.  Kinnie FeilL. Ressie Slevin, RN, BSN

## 2016-05-09 ENCOUNTER — Ambulatory Visit (INDEPENDENT_AMBULATORY_CARE_PROVIDER_SITE_OTHER): Payer: Medicare Other | Admitting: Student

## 2016-05-09 VITALS — BP 128/78 | HR 111 | Temp 97.6°F

## 2016-05-09 DIAGNOSIS — K8309 Other cholangitis: Secondary | ICD-10-CM

## 2016-05-09 DIAGNOSIS — K830 Cholangitis: Secondary | ICD-10-CM | POA: Diagnosis not present

## 2016-05-09 DIAGNOSIS — L219 Seborrheic dermatitis, unspecified: Secondary | ICD-10-CM | POA: Diagnosis present

## 2016-05-09 DIAGNOSIS — Z23 Encounter for immunization: Secondary | ICD-10-CM

## 2016-05-09 MED ORDER — KETOCONAZOLE 2 % EX SHAM: 1.0000 "application " | MEDICATED_SHAMPOO | CUTANEOUS | 0 refills | Status: DC

## 2016-05-09 NOTE — Progress Notes (Addendum)
  Subjective:    Hector Shea is a 26 y.o. old male here for hospital follow up on cholangitis  HPI Cholangitis: s/p ERCP and Laparoscopic cholecystectomy. Denies further abdominal pain, nausea or vomiting. Reports diarrhea. One BM today. Usually one to two bowel movements a day. Consistency is loose. Reports having accidents sometimes. Denies blood in stool. Denies taking MiraLAX. Denies fever. Denies urinary changes.   Skin rash: started while in hospital. On his face and head for one week. Rash is pruritic. Never had rash like this before.   PMH/Problem List: has PARAPARESIS; NYSTAGMUS; Seborrheic dermatitis; SPINA BIFIDA WITH HYDROCEPHALUS LUMBAR REGION; Urinary incontinence without sensory awareness; Presence of ventricular shunt; Preventative health care; Abnormal urine; Pericholecystic abscess; Cholangitis; Cholecystitis; Tachycardia; Jaundice; and Fever on his problem list.   has a past medical history of Neurogenic bladder and Spina bifida (HCC).  FH:  Family History  Problem Relation Age of Onset  . Hypertension Mother   . Hyperlipidemia Mother   . Diabetes Father     on renal dialysis    SH Social History  Substance Use Topics  . Smoking status: Never Smoker  . Smokeless tobacco: Never Used  . Alcohol use No    Review of Systems Review of systems negative except for pertinent positives and negatives in history of present illness above.     Objective:     Vitals:   05/09/16 1438  BP: 128/78  Pulse: (!) 111  Temp: 97.6 F (36.4 C)  TempSrc: Oral  SpO2: 99%    Physical Exam GEN: appears well, no apparent distress. Head: normocephalic and atraumatic  Eyes: conjunctiva without injection, sclera anicteric CVS: Tachycardic to 102, RR, nl S1&S2, no murmurs,  cap refills < 2 secs RESP: speaks in full sentence, no IWOB, CTAB GI: BS present & normal, soft, NTND, no guarding, no rebound, no mass, laparoscopic incision sites healing well MSK: no focal tenderness or notable  swelling SKIN: whitish to grayish flakes/crust overlying erythematous skin base on his face, wide area of skin erythema with clear margin over right temporal area. There are whitish-grayish skin flakes/crusts on the edge . See picture below for more more     NEURO: alert and oiented appropriately, no gross defecits  PSYCH: euthymic mood with congruent affect    Assessment and Plan:  Cholangitis Resolved. Status post ERCP and cholecystectomy. No abdominal pain or tenderness on exam. Incision sites healing well. He has loose stools likely due to cholecystectomy. -Recommended reducing fatty or greasy foods.  Seborrheic dermatitis Patient with skin rash on his face and head suggestive for subordinate dermatitis.  -Gave prescription for ketoconazole shampoo   Orders Placed This Encounter  Procedures  . Flu Vaccine QUAD 36+ mos IM    Return if symptoms worsen or fail to improve.  Hector Herculesaye T Sherria Riemann, MD 05/09/16 Pager: [phone]   Addended to insert picture

## 2016-05-09 NOTE — Patient Instructions (Signed)
It was great seeing you today! We have addressed the following issues today  1. Diarrhea: This is likely due to your gallbladder surgery. Minimize greasy/oily foods.  2.   Skin rash: I have sent a prescription for shampoo to your pharmacy. Please use this medication as prescribed. Please make sure to avoid contact with the eyes.    If we did any lab work today, and the results require attention, either me or my nurse will get in touch with you. If everything is normal, you will get a letter in mail. If you don't hear from us in two weeks, please give us a call. Otherwise, we look forward to seeing you again at your next visit. If you have any questions or concerns before then, please call the clinic at [phone].   Please bring all your medications to every doctors visit   Sign up for My Chart to have easy access to your labs results, and communication with your Primary care physician.     Please check-out at the front desk before leaving the clinic.    Take Care,

## 2016-05-09 NOTE — Assessment & Plan Note (Signed)
Resolved. Status post ERCP and cholecystectomy. No abdominal pain or tenderness on exam. Incision sites healing well. He has loose stools likely due to cholecystectomy. -Recommended reducing fatty or greasy foods.

## 2016-05-09 NOTE — Assessment & Plan Note (Signed)
Patient with skin rash on his face and head suggestive for subordinate dermatitis.  -Gave prescription for ketoconazole shampoo

## 2018-03-07 IMAGING — DX DG CHEST 2V
2 series · 2 of 2 positions shown · non-contrast
Comparison: None.

CLINICAL DATA: Epigastric pain which shortness-of-breath. Possible
UTI on cephalexin. Worsening abdominal pain.

EXAM:
CHEST  2 VIEW

[w chest lat]
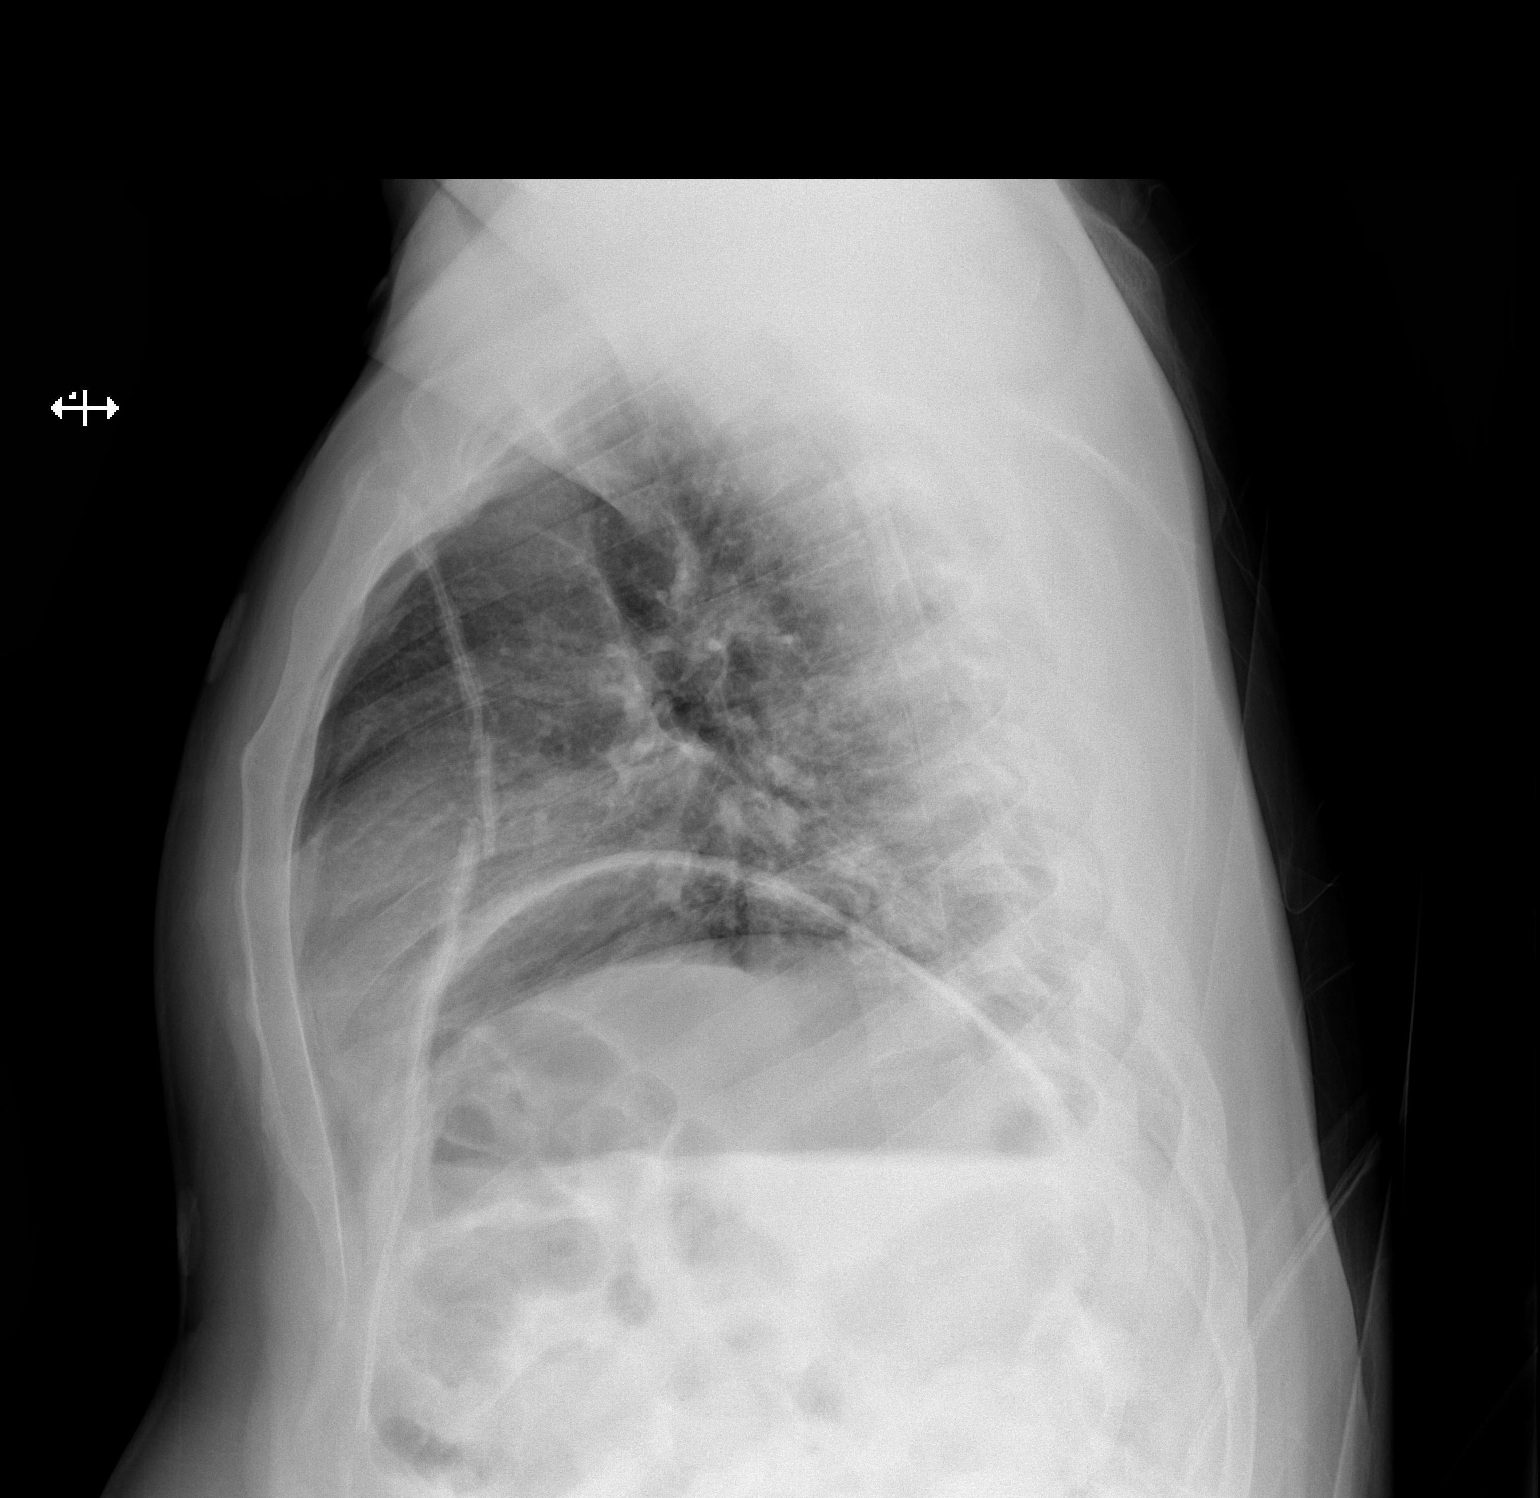

[x chest ap]
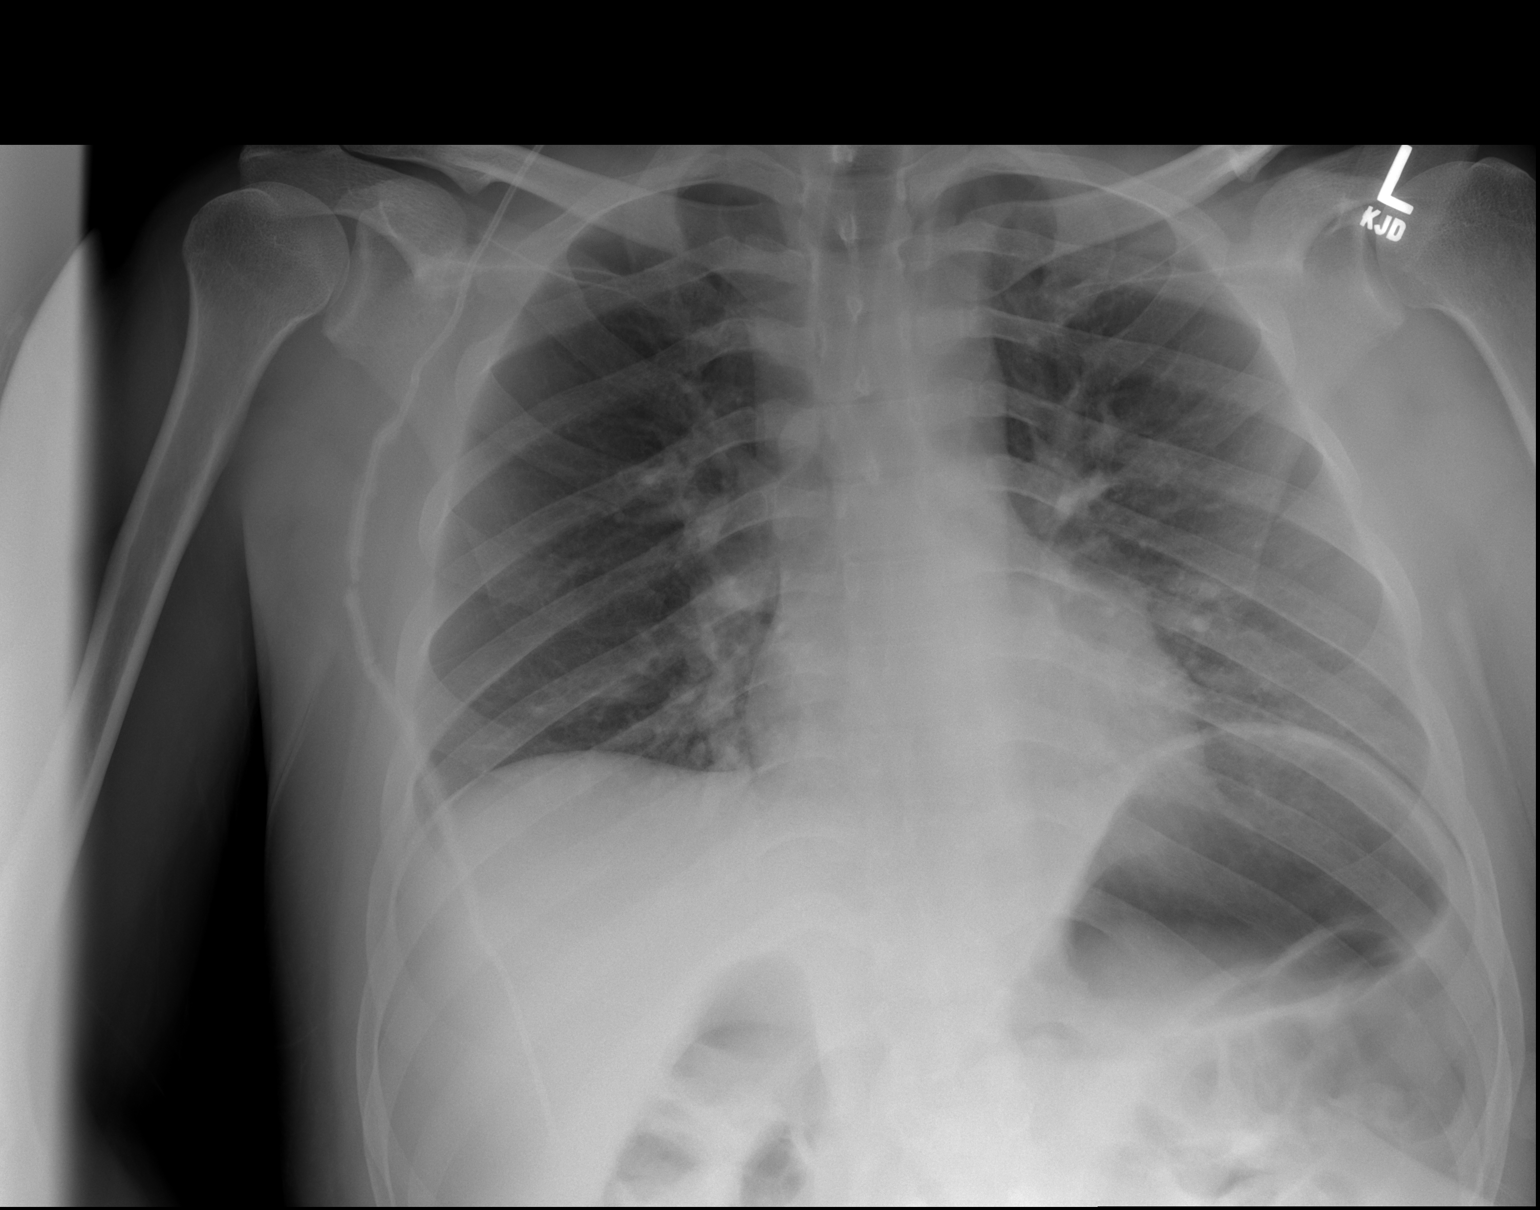

[2 of 2 positions shown; findings below may reference images not displayed]

FINDINGS: Right-sided ventriculoperitoneal shunt with irregularity and
discontinuity of the tubing over the right flank. Lungs are somewhat
hypoinflated with mild prominence of the perihilar markings. No
focal airspace consolidation or effusion. Cardiomediastinal
silhouette is within normal. Mild gastric distension.
IMPRESSION: Minimal prominence of the perihilar markings which may be due to the
degree of hypoinflation, although cannot exclude viral
bronchopneumonia or minimal vascular congestion.

Right-sided ventriculoperitoneal shunt with focal irregularity/
discontinuity of the shunt over the lateral thorax.

## 2018-03-09 IMAGING — RF DG ERCP WO/W SPHINCTEROTOMY
1 series · 6 of 6 positions shown · non-contrast
Comparison: 04/28/2016

CLINICAL DATA: Biliary dilatation, cholelithiasis

EXAM:
ERCP with balloon sweep for stone removal
TECHNIQUE: Multiple spot images obtained with the fluoroscopic device and
submitted for interpretation post-procedure.
FLUOROSCOPY TIME:  Fluoroscopy Time:  3 minutes 18 seconds

[Series 1: run · 6 of 6 slices shown]
[im 1/6]
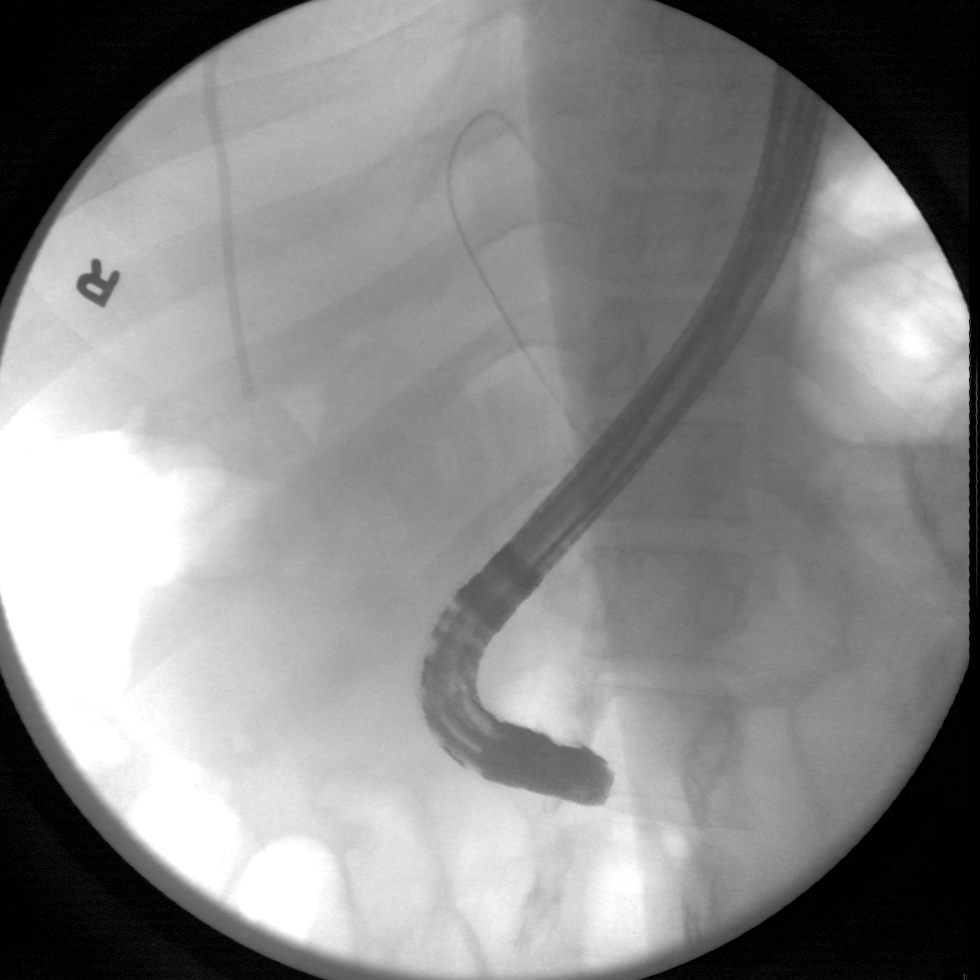
[im 2/6]
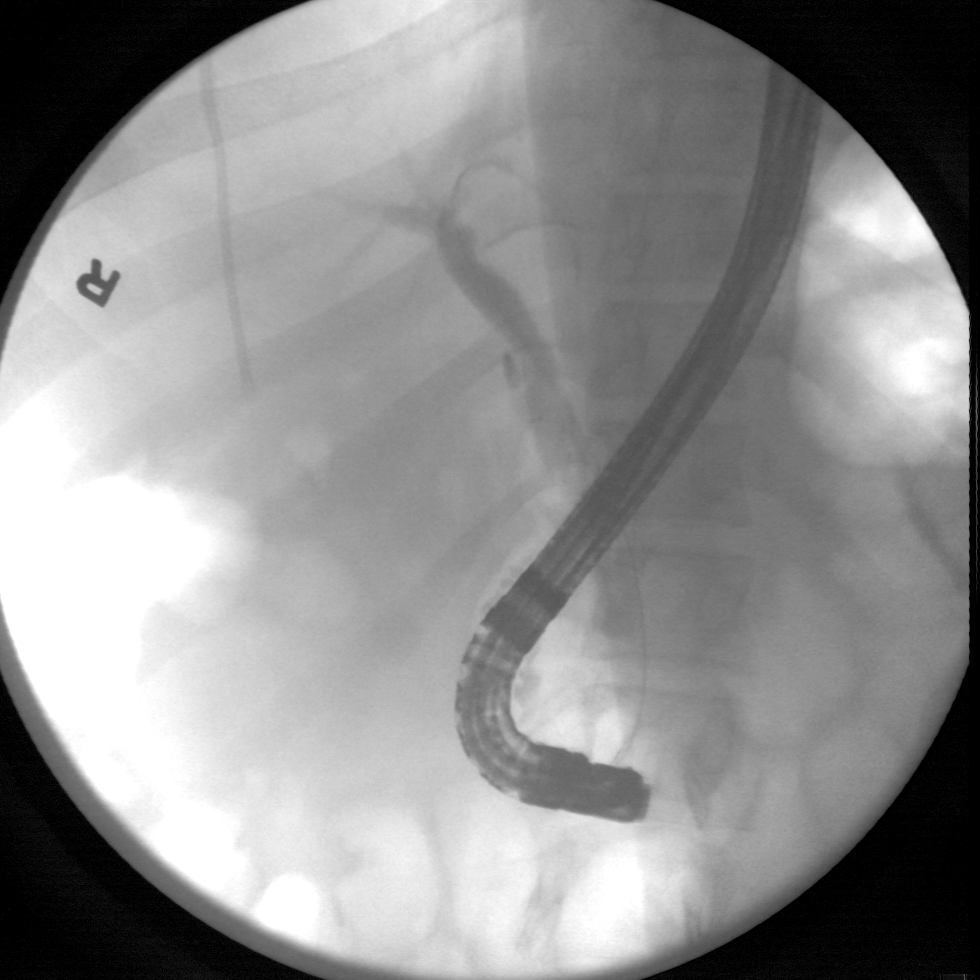
[im 3/6]
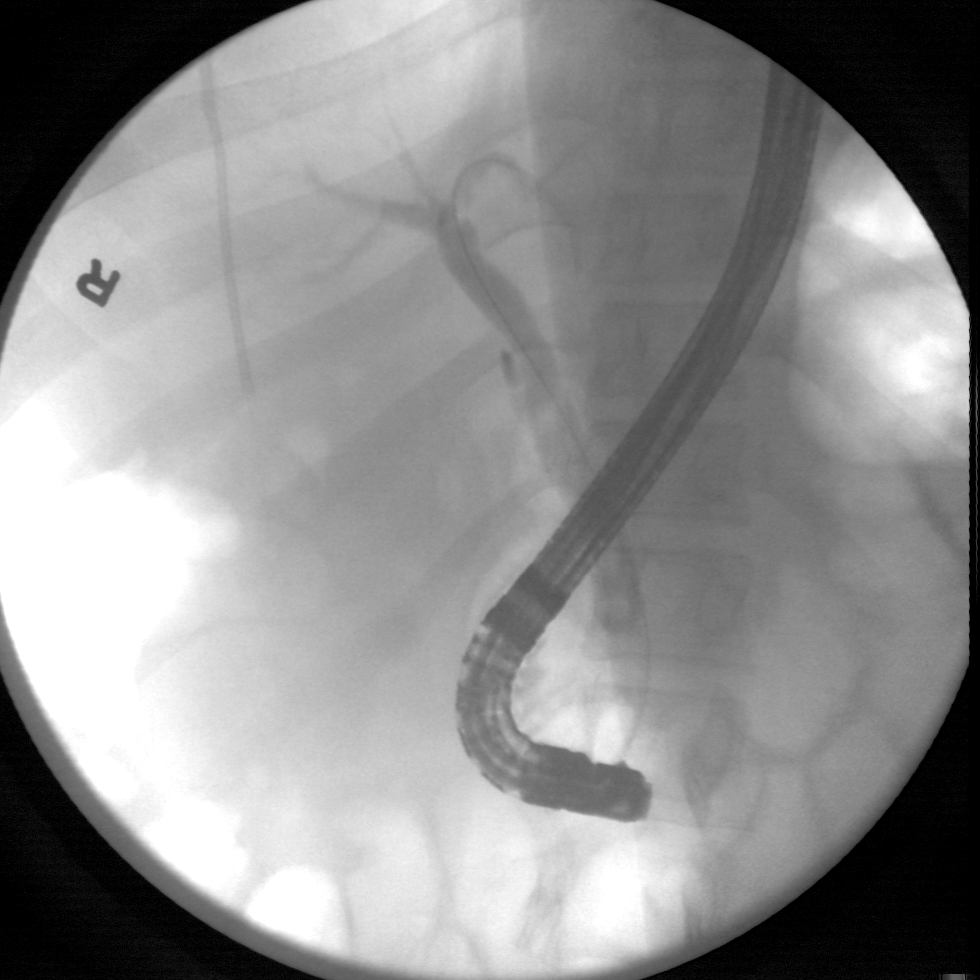
[im 4/6]
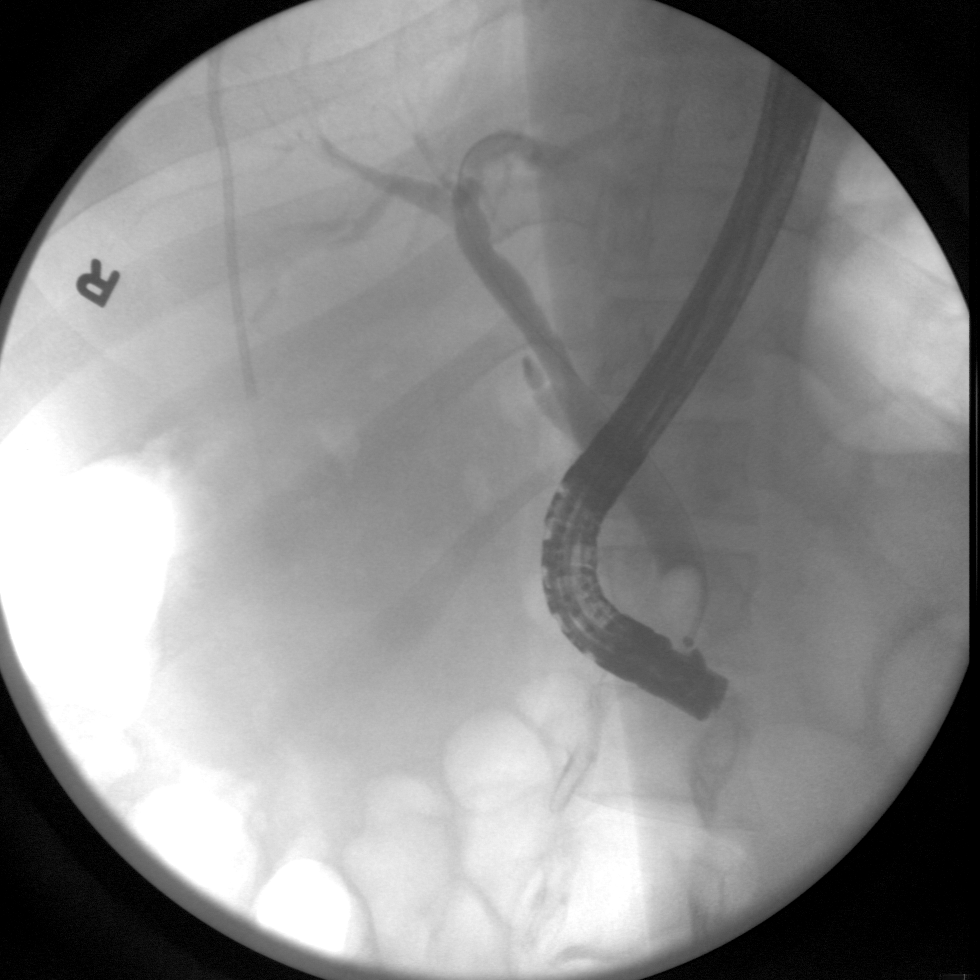
[im 5/6]
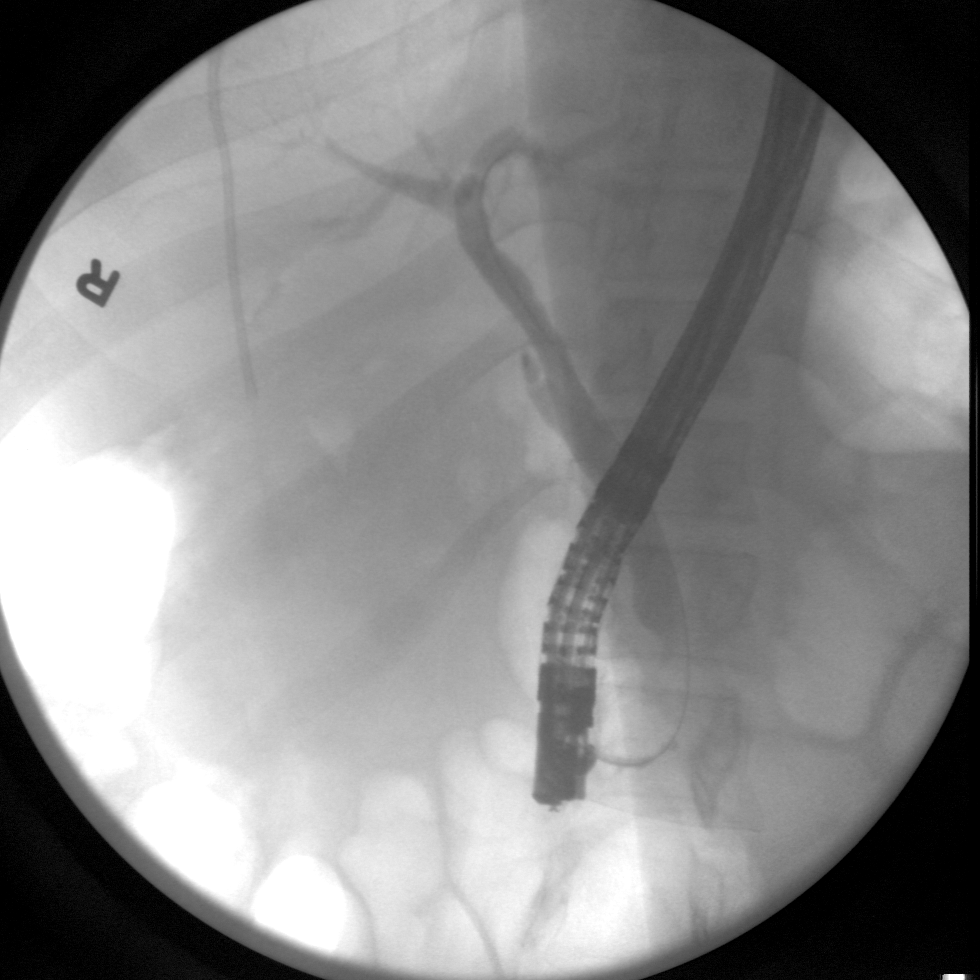
[im 6/6]
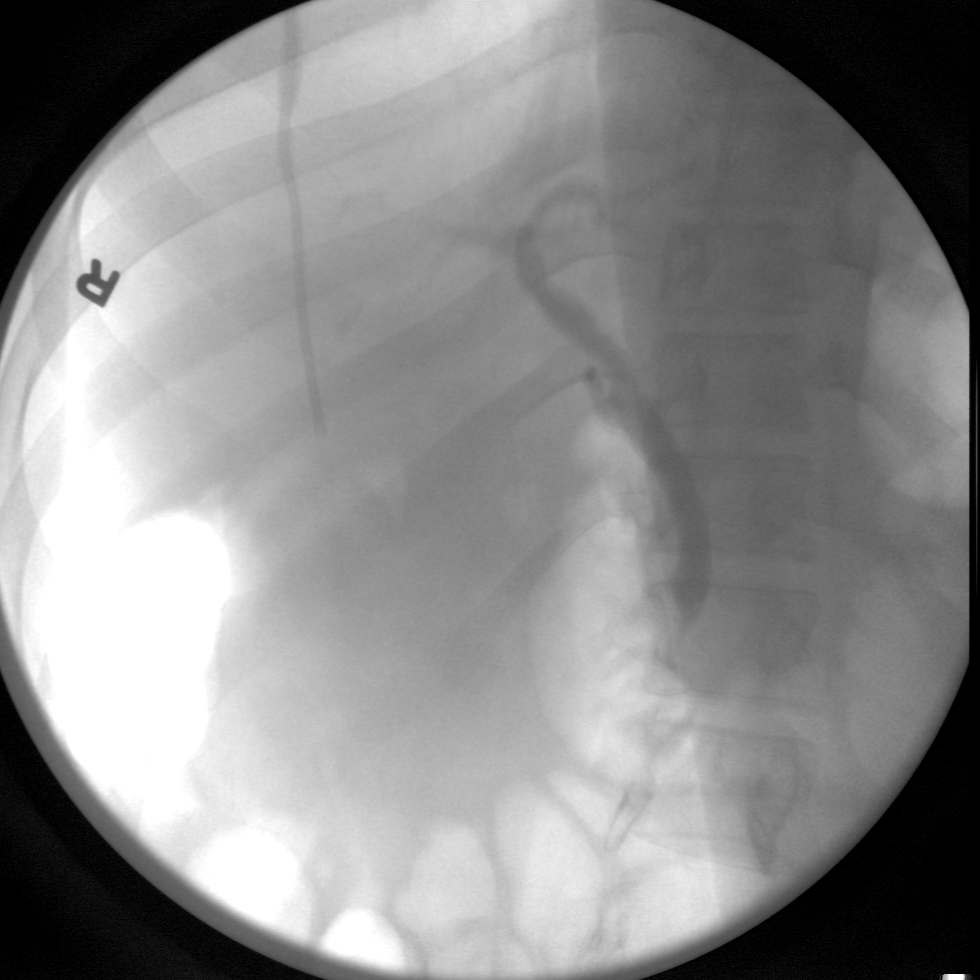

[6 of 6 positions shown; findings below may reference images not displayed]

FINDINGS: Six spot fluoroscopic images performed during the ERCP and
intervention. Retrograde cholangiogram demonstrates numerous
irregular small filling defects within the common bile duct
compatible with choledocholithiasis. Mild biliary dilatation.
Balloon sweep for stone removal performed.
IMPRESSION: Limited imaging during ERCP for choledocholithiasis with balloon
sweep for stone removal.

These images were submitted for radiologic interpretation only.
Please see the procedural report for the amount of contrast and the
fluoroscopy time utilized.

## 2020-02-14 ENCOUNTER — Emergency Department (HOSPITAL_COMMUNITY): Payer: Medicare Other

## 2020-02-14 ENCOUNTER — Other Ambulatory Visit: Payer: Self-pay

## 2020-02-14 ENCOUNTER — Emergency Department (HOSPITAL_COMMUNITY)
Admission: EM | Admit: 2020-02-14 | Discharge: 2020-02-15 | Disposition: A | Discharge status: Other Acute Inpatient Hospital | Payer: Medicare Other | Attending: Emergency Medicine | Admitting: Emergency Medicine

## 2020-02-14 ENCOUNTER — Encounter (HOSPITAL_COMMUNITY): Payer: Self-pay | Admitting: Pharmacy Technician

## 2020-02-14 DIAGNOSIS — N492 Inflammatory disorders of scrotum: Secondary | ICD-10-CM | POA: Insufficient documentation

## 2020-02-14 DIAGNOSIS — N50812 Left testicular pain: Secondary | ICD-10-CM | POA: Insufficient documentation

## 2020-02-14 DIAGNOSIS — Z20822 Contact with and (suspected) exposure to covid-19: Secondary | ICD-10-CM | POA: Insufficient documentation

## 2020-02-14 DIAGNOSIS — R000 Tachycardia, unspecified: Secondary | ICD-10-CM | POA: Insufficient documentation

## 2020-02-14 DIAGNOSIS — R509 Fever, unspecified: Secondary | ICD-10-CM | POA: Diagnosis present

## 2020-02-14 LAB — CBC WITH DIFFERENTIAL/PLATELET
Abs Immature Granulocytes: 0.1 10*3/uL — ABNORMAL HIGH (ref 0.00–0.07)
Basophils Absolute: 0 10*3/uL (ref 0.0–0.1)
Basophils Relative: 0 %
Eosinophils Absolute: 0 10*3/uL (ref 0.0–0.5)
Eosinophils Relative: 0 %
HCT: 41 % (ref 39.0–52.0)
Hemoglobin: 13.2 g/dL (ref 13.0–17.0)
Immature Granulocytes: 1 %
Lymphocytes Relative: 8 %
Lymphs Abs: 1.3 10*3/uL (ref 0.7–4.0)
MCH: 29.1 pg (ref 26.0–34.0)
MCHC: 32.2 g/dL (ref 30.0–36.0)
MCV: 90.5 fL (ref 80.0–100.0)
Monocytes Absolute: 0.8 10*3/uL (ref 0.1–1.0)
Monocytes Relative: 5 %
Neutro Abs: 13.7 10*3/uL — ABNORMAL HIGH (ref 1.7–7.7)
Neutrophils Relative %: 86 %
Platelets: 530 10*3/uL — ABNORMAL HIGH (ref 150–400)
RBC: 4.53 MIL/uL (ref 4.22–5.81)
RDW: 13.1 % (ref 11.5–15.5)
WBC: 16 10*3/uL — ABNORMAL HIGH (ref 4.0–10.5)
nRBC: 0 % (ref 0.0–0.2)

## 2020-02-14 LAB — COMPREHENSIVE METABOLIC PANEL
ALT: 91 U/L — ABNORMAL HIGH (ref 0–44)
AST: 50 U/L — ABNORMAL HIGH (ref 15–41)
Albumin: 2.5 g/dL — ABNORMAL LOW (ref 3.5–5.0)
Alkaline Phosphatase: 213 U/L — ABNORMAL HIGH (ref 38–126)
Anion gap: 13 (ref 5–15)
BUN: 10 mg/dL (ref 6–20)
CO2: 23 mmol/L (ref 22–32)
Calcium: 8.6 mg/dL — ABNORMAL LOW (ref 8.9–10.3)
Chloride: 102 mmol/L (ref 98–111)
Creatinine, Ser: 1.16 mg/dL (ref 0.61–1.24)
GFR, Estimated: >60 mL/min (ref >60)
Glucose, Bld: 183 mg/dL — ABNORMAL HIGH (ref 70–99)
Potassium: 3.7 mmol/L (ref 3.5–5.1)
Sodium: 138 mmol/L (ref 135–145)
Total Bilirubin: 1.1 mg/dL (ref 0.3–1.2)
Total Protein: 7.2 g/dL (ref 6.5–8.1)

## 2020-02-14 LAB — LACTIC ACID, PLASMA: Lactic Acid, Venous: 1.6 mmol/L (ref 0.5–1.9)

## 2020-02-14 NOTE — ED Triage Notes (Signed)
Pt here with complaints of fever onset today. Pt had surgery last month on his leg. Tachycardic in the 130's in triage. Pt states he took tylenol PTA.

## 2020-02-15 ENCOUNTER — Emergency Department (HOSPITAL_COMMUNITY): Payer: Medicare Other

## 2020-02-15 DIAGNOSIS — N492 Inflammatory disorders of scrotum: Secondary | ICD-10-CM | POA: Diagnosis not present

## 2020-02-15 LAB — URINALYSIS, ROUTINE W REFLEX MICROSCOPIC
Bilirubin Urine: NEGATIVE
Glucose, UA: NEGATIVE mg/dL
Ketones, ur: 20 mg/dL — AB
Nitrite: POSITIVE — AB
Protein, ur: 100 mg/dL — AB
RBC / HPF: >50 RBC/hpf — ABNORMAL HIGH (ref 0–5)
Specific Gravity, Urine: 1.018 (ref 1.005–1.030)
WBC, UA: >50 WBC/hpf — ABNORMAL HIGH (ref 0–5)
pH: 5 (ref 5.0–8.0)

## 2020-02-15 LAB — RESPIRATORY PANEL BY RT PCR (FLU A&B, COVID)
Influenza A by PCR: NEGATIVE
Influenza B by PCR: NEGATIVE
SARS Coronavirus 2 by RT PCR: NEGATIVE

## 2020-02-15 LAB — LACTIC ACID, PLASMA: Lactic Acid, Venous: 1 mmol/L (ref 0.5–1.9)

## 2020-02-15 MED ORDER — ACETAMINOPHEN 500 MG PO TABS
1000.0000 mg | ORAL_TABLET | Freq: Once | ORAL | Status: AC
  Administered 2020-02-15: 1000 mg via ORAL
  Filled 2020-02-15: qty 2

## 2020-02-15 MED ORDER — VANCOMYCIN HCL 1500 MG/300ML IV SOLN
1500.0000 mg | Freq: Once | INTRAVENOUS | Status: AC
  Administered 2020-02-15: 1500 mg via INTRAVENOUS
  Filled 2020-02-15 (×2): qty 300

## 2020-02-15 MED ORDER — PIPERACILLIN-TAZOBACTAM 3.375 G IVPB 30 MIN
3.3750 g | Freq: Once | INTRAVENOUS | Status: AC
  Administered 2020-02-15: 3.375 g via INTRAVENOUS
  Filled 2020-02-15: qty 50

## 2020-02-15 MED ORDER — SODIUM CHLORIDE 0.9 % IV BOLUS
1000.0000 mL | Freq: Once | INTRAVENOUS | Status: AC
  Administered 2020-02-15: 1000 mL via INTRAVENOUS

## 2020-02-15 NOTE — ED Notes (Signed)
Notified Pfieffer MD that EMTALA information needs updated d/t outside of 30 min window.

## 2020-02-15 NOTE — ED Provider Notes (Signed)
Hector Shea [facility] EMERGENCY DEPARTMENT Provider Note   CSN: [id number] Arrival date & time: 02/14/20  1742     History Chief Complaint  Patient presents with  . Fever    Hector Shea is a 29 y.o. male.  The history is provided by the patient and medical records. The history is limited by a language barrier. A language interpreter was used.  Fever   29 y.o. M with hx of neurogenic bladder, spina bifida, hydrocele with recent excision on 01/25/20 at [facility] with Dr. Raoul Pitch, presenting to the ED with fever and drainage from wound.  States his surgery went fine without noted complications.  He completed course of bactrim after surgery for 14 days.  He did have penrose drain that was removed 02/02/20.  States yesterday he started noticing some drainage from the left sided incision, appears yellow in color.  He is not sure about any increased redness/swelling.  He denies any increased pain of the area.  He does have indwelling foley catheter which has been draining fine, no issues with that.  Denies cough, chest pain, SOB.  He did take tylenol PTA in triage about 9 hours ago due to chills so assumed he had fever but did not check temperature.  He did call his surgeons office today and they encouraged ER evaluation.  Past Medical History:  Diagnosis Date  . Neurogenic bladder   . Spina bifida [name])     Patient Active Problem List   Diagnosis Date Noted  . Fever   . Jaundice   . 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 Cholangitis 04/28/2016  . Cholecystitis   . Tachycardia   . Abnormal urine 04/26/2016  . Preventative health care 10/18/2014  . Presence of ventricular shunt 07/27/2012  . NYSTAGMUS 11/13/2009  . PARAPARESIS 09/11/2009  . Seborrheic dermatitis 09/11/2009  . 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 Urinary incontinence without sensory awareness 09/11/2009    Past Surgical History:  Procedure Laterality Date  . ANKLE SURGERY   2001  . CHOLECYSTECTOMY N/A 04/30/2016   Procedure: LAPAROSCOPIC CHOLECYSTECTOMY;  Surgeon: Gaynelle Adu, MD;  Location: [facility] OR;  Service: General;  Laterality: N/A;  . ENDOSCOPIC RETROGRADE CHOLANGIOPANCREATOGRAPHY (ERCP) WITH PROPOFOL N/A 04/29/2016   Procedure: ENDOSCOPIC RETROGRADE CHOLANGIOPANCREATOGRAPHY (ERCP) WITH PROPOFOL;  Surgeon: Jeani Hawking, MD;  Location: [facility] ENDOSCOPY;  Service: Endoscopy;  Laterality: N/A;  . mass removed right leg  1998  . MENINGOCELE REPAIR  03-05-1992   In [place], no details  . SACRAL DECUBITUS ULCER EXCISION  2007  . VENTRICULOPERITONEAL SHUNT  1993       Family History  Problem Relation Age of Onset  . Hypertension Mother   . Hyperlipidemia Mother   . Diabetes Father        on renal dialysis    Social History   Tobacco Use  . Smoking status: Never Smoker  . Smokeless tobacco: Never Used  Substance Use Topics  . Alcohol use: No  . Drug use: No    Home Medications Prior to Admission medications   Medication Sig Start Date End Date Taking? Authorizing Provider  ketoconazole (NIZORAL) 2 % shampoo Apply 1 application topically 2 (two) times a week. 05/09/16   Almon Hercules, MD  oxybutynin (DITROPAN) 5 MG tablet Take 2 tablets in the morning, 1 tablet at lunch, and 2 tablets at night for bladder spasm. Patient taking differently: Take 5-10 mg by mouth See admin instructions. Take 2 tablets in the morning, 1 tablet  at lunch, and 2 tablets at night for bladder spasm. 10/04/13   [name], MD  polyethylene glycol (MIRALAX / Ethelene Hal) packet Take 17 g by mouth daily. 05/07/16   [name], MD    Allergies    Advil [ibuprofen]  Review of Systems   Review of Systems  Constitutional: Positive for fever.  All other systems reviewed and are negative.   Physical Exam Updated Vital Signs BP (!) 147/97 (BP Location: Right Arm)   Pulse (!) 117   Temp 99.3 F (37.4 C) (Oral)   Resp 18   SpO2 96%   Physical Exam Vitals and nursing note  reviewed.  Constitutional:      Appearance: He is well-developed.  HENT:     Head: Normocephalic and atraumatic.  Eyes:     Conjunctiva/sclera: Conjunctivae normal.     Pupils: Pupils are equal, round, and reactive to light.  Cardiovascular:     Rate and Rhythm: Regular rhythm. Tachycardia present.     Heart sounds: Normal heart sounds.     Comments: Tach 120's during exam Pulmonary:     Effort: Pulmonary effort is normal.     Breath sounds: Normal breath sounds.  Abdominal:     General: Bowel sounds are normal.     Palpations: Abdomen is soft.  Genitourinary:    Comments: Exam chaperoned by family Foley catheter in place Large right hydrocele remains present Incision on left with white/yellow purulent drainage; does not appear to be erythema extender to perineum, no apparent tissue crepitus Musculoskeletal:        General: Normal range of motion.     Cervical back: Normal range of motion.     Comments: Shortened LE extremities with poor tone, knee immobilizer on right  Skin:    General: Skin is warm and dry.  Neurological:     Mental Status: He is alert and oriented to person, place, and time.     ED Results / Procedures / Treatments   Labs (all labs ordered are listed, but only abnormal results are displayed) Labs Reviewed  COMPREHENSIVE METABOLIC PANEL - Abnormal; Notable for the following components:      Result Value   Glucose, Bld 183 (*)    Calcium 8.6 (*)    Albumin 2.5 (*)    AST 50 (*)    ALT 91 (*)    Alkaline Phosphatase 213 (*)    All other components within normal limits  CBC WITH DIFFERENTIAL/PLATELET - Abnormal; Notable for the following components:   WBC 16.0 (*)    Platelets 530 (*)    Neutro Abs 13.7 (*)    Abs Immature Granulocytes 0.10 (*)    All other components within normal limits  URINALYSIS, ROUTINE W REFLEX MICROSCOPIC - Abnormal; Notable for the following components:   Color, Urine AMBER (*)    APPearance CLOUDY (*)    Hgb urine  dipstick MODERATE (*)    Ketones, ur 20 (*)    Protein, ur 100 (*)    Nitrite POSITIVE (*)    Leukocytes,Ua LARGE (*)    RBC / HPF >50 (*)    WBC, UA >50 (*)    Bacteria, UA MANY (*)    Non Squamous Epithelial 0-5 (*)    All other components within normal limits  RESPIRATORY PANEL BY RT PCR (FLU A&B, COVID)  URINE CULTURE  CULTURE, BLOOD (ROUTINE X 2)  CULTURE, BLOOD (ROUTINE X 2)  LACTIC ACID, PLASMA  LACTIC ACID, PLASMA  EKG EKG Interpretation  Date/Time:  Monday February 14 2020 17:55:58 EST Ventricular Rate:  139 PR Interval:  126 QRS Duration: 82 QT Interval:  288 QTC Calculation: 438 R Axis:   97 Text Interpretation: Sinus tachycardia Rightward axis Borderline ECG Confirmed by Zadie Rhine ([id number]) on 02/15/2020 2:45:01 AM   Radiology DG Chest 2 View  Result Date: 02/14/2020 CLINICAL DATA:  Fever EXAM: CHEST - 2 VIEW COMPARISON:  05/01/2016 FINDINGS: Heart and mediastinal contours are within normal limits. No focal opacities or effusions. No acute bony abnormality. IMPRESSION: No active cardiopulmonary disease. Electronically Signed   By: Charlett Nose M.D.   On: 02/14/2020 18:49   US SCROTUM W/DOPPLER  Result Date: 02/15/2020 CLINICAL DATA:  Status post left hydrocele drainage now with purulence drainage from the surgical incision EXAM: SCROTAL ULTRASOUND DOPPLER ULTRASOUND OF THE TESTICLES TECHNIQUE: Complete ultrasound examination of the testicles, epididymis, and other scrotal structures was performed. Color and spectral Doppler ultrasound were also utilized to evaluate blood flow to the testicles. COMPARISON:  None. FINDINGS: Right testicle Measurements: 3.1 x 1.8 x 1.7 cm. No mass or microlithiasis visualized. Left testicle Measurements: 4.0 x 1.8 x 2.1 cm. No mass or microlithiasis visualized. Right epididymis:  Normal in size and appearance. Left epididymis: Not visualized. The epididymis is essentially obscured by extreme left scrotal wall thickening with  areas of echogenic shadowing within the wall most in keeping with subcutaneous gas. There is marked hyperemia involving the thickened scrotal wall. And avascular complex cystic collection is seen within the left hemi scrotal wall measuring 2.7 x 1.7 by 3.3 cm demonstrating internal septation and/or debris likely representing a small intramural abscess or hematoma. Hydrocele: A large right hydrocele is present measuring at least 10.7 x 5.7 x 8.8 cm Varicocele:  None visualized. Pulsed Doppler interrogation of both testes demonstrates normal low resistance arterial and venous waveforms involving the right testicle. Normal arterial and venous vascularity is reported of the left testicle, however, no images are presented on the current study to support this finding. IMPRESSION: Left scrotal wall thickening and hyperemia suggestive of cellulitis of the left hemiscrotum. Intramural 3.3 cm avascular complex cystic collection likely represents an intramural abscess or hematoma. Large right hydrocele. Reportedly normal left testicular vascularity, though I do not have images to support this. If there is clinical concern for left testicular infarction, repeat Doppler sonography of the testicles should be performed. Electronically Signed   By: Helyn Numbers MD   On: 02/15/2020 05:58    Procedures Procedures (including critical care time)  CRITICAL CARE Performed by: Garlon Hatchet   Total critical care time: 45 minutes  Critical care time was exclusive of separately billable procedures and treating other patients.  Critical care was necessary to treat or prevent imminent or life-threatening deterioration.  Critical care was time spent personally by me on the following activities: development of treatment plan with patient and/or surrogate as well as nursing, discussions with consultants, evaluation of patient's response to treatment, examination of patient, obtaining history from patient or surrogate, ordering  and performing treatments and interventions, ordering and review of laboratory studies, ordering and review of radiographic studies, pulse oximetry and re-evaluation of patient's condition.   Medications Ordered in ED Medications  vancomycin (VANCOREADY) IVPB 1500 mg/300 mL (has no administration in time range)  piperacillin-tazobactam (ZOSYN) IVPB 3.375 g (has no administration in time range)  acetaminophen (TYLENOL) tablet 1,000 mg (1,000 mg Oral Given 02/15/20 0408)  sodium chloride 0.9 % bolus 1,000 mL (0 mLs  Intravenous Stopped 02/15/20 0524)    ED Course  I have reviewed the triage vital signs and the nursing notes.  Pertinent labs & imaging results that were available during my care of the patient were reviewed by me and considered in my medical decision making (see chart for details).    MDM Rules/Calculators/A&P  29 year old male presenting to the ED with drainage from left testicle.  He is status post hydrocele excision on 01[facility]25/2020 [facility]  Had penrose drain that was removed 02/02/20.  Completed 14 day course of bactrim post-op.  Drainage began yesterday along with subjective fever/chills.  He has low grade fever and tachycardia on arrival to ED.  Does have purulent drainage from incision along left testicle.  Continues to have very large right hydrocele but no drainage from this side.  Foley catheter in place chronically.  Does have leukocytosis but normal lactate at 1.6.  Given Tylenol for fever along with IV fluids.  Will obtain scrotal US with doppler, UA w/culture, set of blood cultures, covid screen.  CXR from triage is negative.  He denies chest pain or SOB.  HR trended down into 90's after tylenol and IVF.  Repeat lacate improvement at 1.0.  UA nitrite + but likely chronically colonized.  Culture pending.  US with findings of scrotal wall cellultis and likely left scrotal abscess.  Broad spectrum abx have been started.  Blood cultures pending.  Initially  discussed case with resident Koleen NimrodYousef Aby-Salha who refused transfer due to bed capacity of the hospital.  I inquired about ED to ED transfer since this was a direct post-op complication, however was told that was not an option as he would need admission and since we have urology here it should be managed in house.  Discussed with our on call urology, Dr. Annabell HowellsWrenn-- he reached out to [facility], they are now willing to accept transfer ED to ED for urology evaluation.  Spoke with ED physician, Dr. Marisa Severinlga Otter who accepted in transfer.  Patient and family have been updated on need for transfer and are agreeable.  Face sheet has been faxed, radiology burning disc with images right now.  Final Clinical Impression(s) / ED Diagnoses Final diagnoses:  Pain in left testicle  Scrotal abscess    Rx / DC Orders ED Discharge Orders    None       Garlon HatchetSanders, Verla Bryngelson M, PA-C 02/15/20 [id number]    Zadie RhineWickline, Donald, MD 02/15/20 [phone]

## 2020-02-15 NOTE — ED Notes (Signed)
PT called family at [phone] Pt notified his Mother can not ride in EMS to [facility] and EMS can not transport his electric WC in EMS

## 2020-02-15 NOTE — ED Notes (Signed)
Nephew in PT room now to take [facility] home. Hector Shea

## 2020-02-15 NOTE — ED Notes (Signed)
TC to  [facility] who transfered my call to [facility] for report on trans to . [place] state reported the plan was to arrive to [facility] ED for transport at 11AM

## 2020-02-15 NOTE — ED Notes (Signed)
TC to [facility]  [phone]  For confirmation of transport to [facility] ED. [facility] transport reported [facility] was supposed to transport. [facility] transport reported they would reach out to [facility] and call this Clinical research associate with up date on transport.

## 2020-02-15 NOTE — ED Notes (Signed)
TC from [facility] [phone] [facility]

## 2020-02-15 NOTE — ED Notes (Signed)
Report given to Malachi Carl, RN - Emergency Room Charge Nurse for [facility].

## 2020-02-15 NOTE — ED Notes (Signed)
Foley catheter in place and draining yellow urine.

## 2020-02-16 LAB — URINE CULTURE: Culture: >=100000 — AB

## 2020-02-17 ENCOUNTER — Telehealth: Payer: Self-pay

## 2020-02-17 NOTE — Telephone Encounter (Signed)
Pt with + [facility] ED 02/15/20  Sent to [facility] hosp  Per [facility]

## 2020-02-20 LAB — CULTURE, BLOOD (ROUTINE X 2)
Culture: NO GROWTH
Culture: NO GROWTH
Special Requests: ADEQUATE
Special Requests: ADEQUATE

## 2020-03-16 NOTE — Progress Notes (Signed)
   Subjective:    Patient ID: Hector Shea, male    DOB: May 22, 1990, 29 y.o.   MRN: [id number]   CC: Establish care  HPI:  Hector Shea is a very pleasant 29 y.o. male who presents today to establish care.  Initial concerns: Physical  Past medical history: Spina bifida, wheel chair bound, neurogenic bladder -- he said his physician at [facility] should be sending records.   Past surgical history: Right knee surgery performed in [place] in 1998, cholecystectomy in 2018, neurosurgery when he was born, spine surgery at 79-62 months of age.  Current medications: oxybutynin and keflef (few days left for bladder infection).  Family history: Mother with HTN and HLD and father with Diabetes requiring dialysis.   Social history: Never smoker, no alcohol, no drugs. He is wheel chair bound but gets around his home well and can move in and our of his wheel chair on his own. Lives with mom and dad.   Spanish interpreter used for duration of patient encounter  ROS: pertinent noted in the HPI   Objective:  BP 112/86   Pulse 70   SpO2 98%   Vitals and nursing note reviewed  General: NAD, pleasant, able to participate in exam, seated in wheelchair with obvious deformity of the right lower limb HEENT: No pharyngeal erythema, no cervical adenopathy Cardiac: RRR, normal heart sounds, no murmurs. Respiratory: CTAB, normal effort Abdomen: Bowel sounds present, non-tender, non-distended Extremities: Right lower limb shortened with deformity, no cyanosis, no erythema, left lower limb with no cyanosis Skin: warm and dry, no rashes noted Neuro: alert, no obvious focal deficits Psych: Normal affect and mood   Assessment & Plan:    SPINA BIFIDA WITH HYDROCEPHALUS LUMBAR REGION 29 year old male with a history of being wheelchair-bound due to spina bifida with hydrocephalus which he experienced as a child.  The patient currently has immigration paperwork that he is try to get  filled out due to his disability.  He has no complaints at this time with regard to his other past medical history. Plan: -We will plan to meet with patient in 2 weeks at my next available appointment to complete his immigration paperwork -Discussed return precautions at the patient needs [place] between now and then -No blood work or other testing indicated or needed at this time as the patient recently had a host of blood work completed and is not due for any health maintenance other than HIV testing which the patient refuses.   -Has had two Covid19 vaccinations but is missing the booster. His 2nd vaccination was in April. I did recommend he get the booster. He will bring his Covid19 card next visit.   Jackelyn Poling, DO [facility] PGY-1

## 2020-03-16 NOTE — Patient Instructions (Signed)
It was great to meet you!  Our plans for today:  -We do not need to complete any blood work for today. -We are faxing a request for your medical records -I would like for you to make a follow-up appointment to complete your immigration paperwork, you can do this when you go upfront -If you need anything from me in the meantime do not hesitate to reach out.   Take care and seek immediate care sooner if you develop any concerns.   Dr. Daymon [facility]

## 2020-03-17 ENCOUNTER — Ambulatory Visit (INDEPENDENT_AMBULATORY_CARE_PROVIDER_SITE_OTHER): Payer: Medicare Other | Admitting: Family Medicine

## 2020-03-17 ENCOUNTER — Encounter: Payer: Self-pay | Admitting: Family Medicine

## 2020-03-17 ENCOUNTER — Other Ambulatory Visit: Payer: Self-pay

## 2020-03-17 DIAGNOSIS — Q052 Lumbar spina bifida with hydrocephalus: Secondary | ICD-10-CM | POA: Diagnosis present

## 2020-03-17 NOTE — Assessment & Plan Note (Signed)
29 year old male with a history of being wheelchair-bound due to spina bifida with hydrocephalus which he experienced as a child.  The patient currently has immigration paperwork that he is try to get filled out due to his disability.  He has no complaints at this time with regard to his other past medical history. Plan: -We will plan to meet with patient in 2 weeks at my next available appointment to complete his immigration paperwork -Discussed return precautions at the patient needs [place] between now and then -No blood work or other testing indicated or needed at this time as the patient recently had a host of blood work completed and is not due for any health maintenance other than HIV testing which the patient refuses.

## 2020-03-28 NOTE — Progress Notes (Signed)
    SUBJECTIVE:   CHIEF COMPLAINT / HPI:   Form completion-handicap sticker for vehicle: Patient is a 29 year old male with a past medical history significant for spina bifida and hydrocephalus that presents today for form completion for his handicap sticker.  Patient is wheelchair-bound and has difficulties with travel because of IBS.  Elevated BP Pressure elevated in clinic today. He does not have a BP cuff at home. He does not take any blood pressure medications.  Spanish interpreter used for duration of patient encounter  PERTINENT  PMH / PSH: History of spina bifida  OBJECTIVE:   BP (!) 180/110   Pulse (!) 112   SpO2 96%    BP recheck 148/102 Pulse recheck 96  General: NAD, pleasant, seated in wheelchair Cardiac: RRR, no murmurs. Respiratory: CTAB, normal effort, No wheezes, rales or rhonchi Psych: Normal affect and mood  ASSESSMENT/PLAN:    Elevated blood pressure reading Assessment: Patient with elevated blood pressure reading of 180/110 initially with a recheck of 148/102.  The patient does not take any blood pressure medications does not have a blood pressure cuff at home to monitor his pressures.  At his last appointment his blood pressure was within normal limits.  He does not know that he has had high blood pressure issues in the past. Plan: -Discussed with patient that I did not want to start a blood pressure medication based off of 1 number and would like for him to check his blood pressures at home twice per day for 1 week and bring these numbers in at a future visit.  I have sent the patient a blood pressure cuff to his pharmacy and discussed with him that his insurance does not cover it and it is too expensive to afford to please let us know. -Recommended that they make a follow-up appointment about 2 weeks to discuss his blood pressure numbers -Also discussed return precautions including blood pressure levels that he should either call our clinic or go to the  emergency department for should he see dramatically elevated levels.  -Handicap parking form completed  -Covid booster given  Jackelyn Poling, DO [facility]    This note was prepared using Dragon voice recognition software and may include unintentional dictation errors due to the inherent limitations of voice recognition software.

## 2020-03-30 ENCOUNTER — Ambulatory Visit (INDEPENDENT_AMBULATORY_CARE_PROVIDER_SITE_OTHER): Payer: Medicare Other | Admitting: Family Medicine

## 2020-03-30 ENCOUNTER — Encounter: Payer: Self-pay | Admitting: Family Medicine

## 2020-03-30 ENCOUNTER — Other Ambulatory Visit: Payer: Self-pay

## 2020-03-30 VITALS — BP 180/110 | HR 112

## 2020-03-30 DIAGNOSIS — Z23 Encounter for immunization: Secondary | ICD-10-CM

## 2020-03-30 DIAGNOSIS — R030 Elevated blood-pressure reading, without diagnosis of hypertension: Secondary | ICD-10-CM | POA: Diagnosis not present

## 2020-03-30 DIAGNOSIS — Z0289 Encounter for other administrative examinations: Secondary | ICD-10-CM | POA: Diagnosis present

## 2020-03-30 MED ORDER — BLOOD PRESSURE CUFF MISC: 0 refills | Status: AC

## 2020-03-30 NOTE — Patient Instructions (Signed)
It was great to see you! Thank you for allowing me to participate in your care!  Our plans for today:  -We gave you your Covid booster shot -I also filled out your paperwork for your parking pass this should be good for 5 years -Because your blood pressure was elevated in clinic I sent in a prescription for blood pressure cuff.  I would like for you to check your blood pressure twice per day for 1 week and write down all of these numbers and bring them in at a follow-up appointment in the next 1 or 2 weeks.  If you see any blood pressure numbers with the top number being greater than 180 or the bottom number being greater than 110 I would like for you to reach out to our office or go to the emergency department.  Take care and seek immediate care sooner if you develop any concerns.   Dr. Jackelyn Poling, DO [facility]

## 2020-03-30 NOTE — Assessment & Plan Note (Signed)
Assessment: Patient with elevated blood pressure reading of 180/110 initially with a recheck of 148/102.  The patient does not take any blood pressure medications does not have a blood pressure cuff at home to monitor his pressures.  At his last appointment his blood pressure was within normal limits.  He does not know that he has had high blood pressure issues in the past. Plan: -Discussed with patient that I did not want to start a blood pressure medication based off of 1 number and would like for him to check his blood pressures at home twice per day for 1 week and bring these numbers in at a future visit.  I have sent the patient a blood pressure cuff to his pharmacy and discussed with him that his insurance does not cover it and it is too expensive to afford to please let us know. -Recommended that they make a follow-up appointment about 2 weeks to discuss his blood pressure numbers -Also discussed return precautions including blood pressure levels that he should either call our clinic or go to the emergency department for should he see dramatically elevated levels.

## 2020-04-02 ENCOUNTER — Other Ambulatory Visit: Payer: Self-pay | Admitting: Family Medicine

## 2020-04-03 ENCOUNTER — Telehealth: Payer: Self-pay | Admitting: Family Medicine

## 2020-04-03 NOTE — Telephone Encounter (Signed)
Called patient to follow-up on his request to complete an N648 form.  Spoke with patient via a Spanish interpreter.  After speaking with Dr. Manson Passey I recommended the patient reach out to the [facility] [employer] to get his [phone] form completed.  I provided the patient with the telephone number for their office.  Patient states he plans to call their office and will reach out to [place] if he has further questions.  Patient states he is also been unable to start checking his blood pressures yet as well but did not have any blood pressure cuffs but he states that his grandmother does have one and he is going to start checking his blood pressure twice per day and will make a follow-up appointment in about 2 weeks to bring in these blood pressure levels.

## 2020-04-30 NOTE — Patient Instructions (Signed)
It was great to see you! Thank you for allowing me to participate in your care!  Our plans for today:  -Today we discussed your blood pressure.  We are starting a new medication called amlodipine.  We are starting this at a small dose and we may need to increase this dose at her next visit. -I have sent this medicine to your pharmacy -I would like to check your pressures at least once per day later in the day for the next 1 to 2 weeks and follow-up with me in the next 1 to 2 weeks for a blood pressure recheck  Take care and seek immediate care sooner if you develop any concerns.   Dr. Jackelyn Poling, DO [facility]

## 2020-04-30 NOTE — Progress Notes (Signed)
    SUBJECTIVE:   CHIEF COMPLAINT / HPI:   Hypertension: Patient is a 30 year old male who presents today to discuss his hypertension. At previous appointment the patient was noted to have a blood pressure of 180/110.  At the appointment prior to this his blood pressure was normotensive.  Due to his elevated blood pressure we discussed at home monitoring.  The patient was going to get a blood pressure cuff and monitor his pressures for at least 1 week and bring these numbers in for a recheck.  If the patient's pressures continue to be high the plan was to start a medication for hypertension.  The patient does not currently take any blood pressure medications.  Today the patient states he has been checking his blood pressure once or twice per day for the past week and has noted pressures ranging from 83/48 in the early mornings to 140 over the 110s.  He states that when he has these low pressures in the morning he does not have any dizziness or other symptoms.  The majority of the pressures he has submitted are in the mid 140s/100-110   iPad Spanish interpreter used for duration of patient encounter.  PERTINENT  PMH / PSH: None relevant  OBJECTIVE:   BP (!) 172/112   Pulse 89   SpO2 98%    General: NAD, pleasant, able to participate in exam, seated in wheelchair Respiratory: No respiratory distress Psych: Normal affect and mood  ASSESSMENT/PLAN:   Elevated blood pressure reading Assessment: 30 year old male with an elevated blood pressure as last appointment who has been monitoring his pressure since then which have been in the range of his 83/48 in the early morning on occasion but most of the pressures have been in the mid 140s over 100-110s.  The patient denies any symptoms with the occasional low pressure in the morning and I question if this is an accurate pressure particularly with the highest pressure of been at the office today and his last visit, as well as how high his pressures  have been at some of his other document at home numbers..  Blood pressure today of 172/112. Plan: -We will initiate amlodipine 5 mg -Recommend the patient check his pressure for the next 1 week and follow-up in the next 1-2 weeks -At next follow-up if pressure still remains high we will have low threshold to increase to 10 mg.  Particular patient does not have any adverse effects in the meantime due to low pressures.   Jackelyn Poling, DO [facility]    This note was prepared using Dragon voice recognition software and may include unintentional dictation errors due to the inherent limitations of voice recognition software.

## 2020-05-01 ENCOUNTER — Ambulatory Visit (INDEPENDENT_AMBULATORY_CARE_PROVIDER_SITE_OTHER): Payer: Medicare Other | Admitting: Family Medicine

## 2020-05-01 ENCOUNTER — Other Ambulatory Visit: Payer: Self-pay

## 2020-05-01 DIAGNOSIS — R030 Elevated blood-pressure reading, without diagnosis of hypertension: Secondary | ICD-10-CM | POA: Diagnosis present

## 2020-05-01 MED ORDER — AMLODIPINE BESYLATE 5 MG PO TABS: 5.0000 mg | ORAL_TABLET | Freq: Every day | ORAL | 3 refills | Status: DC

## 2020-05-01 NOTE — Assessment & Plan Note (Signed)
Assessment: 30 year old male with an elevated blood pressure as last appointment who has been monitoring his pressure since then which have been in the range of his 83/48 in the early morning on occasion but most of the pressures have been in the mid 140s over 100-110s.  The patient denies any symptoms with the occasional low pressure in the morning and I question if this is an accurate pressure particularly with the highest pressure of been at the office today and his last visit, as well as how high his pressures have been at some of his other document at home numbers..  Blood pressure today of 172/112. Plan: -We will initiate amlodipine 5 mg -Recommend the patient check his pressure for the next 1 week and follow-up in the next 1-2 weeks -At next follow-up if pressure still remains high we will have low threshold to increase to 10 mg.  Particular patient does not have any adverse effects in the meantime due to low pressures.

## 2020-05-10 NOTE — Progress Notes (Signed)
    SUBJECTIVE:   CHIEF COMPLAINT / HPI:   Hypertension follow-up: Patient is 30 year old male presented today for follow-up on elevated blood pressure.  During last appointment we did initiate amlodipine 5 mg/day.  The patient has been checking his blood pressures for the past week with plan to follow-up today.  His pressures of the past week have averaged in the 130s-150s systolic over 90s-100 diastolic.  Spanish interpreter used for duration of patient encounter  PERTINENT  PMH / PSH: History of spinal bifida  OBJECTIVE:   BP (!) 158/90   Pulse 74   SpO2 98%    150/90 on recheck.   General: NAD, pleasant, able to participate in exam Cardiac: RRR, no murmurs. Respiratory: CTAB, normal effort Psych: Normal affect and mood  ASSESSMENT/PLAN:   Hypertension Assessment: 30 year old male presents for hypertension follow-up.  He has been currently taking amlodipine 5 mg/day.  Blood pressure ranges over the past 1 week have continued to remain elevated.  Blood pressure in office today of 158/90, 150/90 on recheck.  Patient endorses no difficulty with taking or affording his medications.  Endorses no adverse effects at this time Plan: -We will increase amlodipine to 10 mg/day.  I discussed with the patient on taking 2 of his 5 mg tablets each day until he runs out and I have sent in a prescription for 10 mg tablets which he will take 1 of at that time -Recommend he continue to check his blood pressures and follow-up in 1 month bring in these numbers so we can see his average pressures at home   Jackelyn Poling, DO [facility]    This note was prepared using Dragon voice recognition software and may include unintentional dictation errors due to the inherent limitations of voice recognition software.

## 2020-05-10 NOTE — Patient Instructions (Signed)
It was great to see you! Thank you for allowing me to participate in your care!  Our plans for today:  -Today we checked your blood pressure and decided to increase your amlodipine to 10 mg/day.  I would like for you to take 2 of the 5 mg tablets that you already have until you run out and I have sent in a new prescription that is 10 mg in each tablet so you only need to take 1 of those per day -I would like to see you back in 1 month for a recheck of your blood pressure -I recommend that you keep track of your blood pressures at home between now and then and bring in these numbers  Take care and seek immediate care sooner if you develop any concerns.   Dr. Jackelyn Poling, DO [facility]

## 2020-05-12 ENCOUNTER — Other Ambulatory Visit: Payer: Self-pay

## 2020-05-12 ENCOUNTER — Ambulatory Visit (INDEPENDENT_AMBULATORY_CARE_PROVIDER_SITE_OTHER): Payer: Medicare Other | Admitting: Family Medicine

## 2020-05-12 ENCOUNTER — Encounter: Payer: Self-pay | Admitting: Family Medicine

## 2020-05-12 DIAGNOSIS — I10 Essential (primary) hypertension: Secondary | ICD-10-CM | POA: Diagnosis present

## 2020-05-12 MED ORDER — AMLODIPINE BESYLATE 10 MG PO TABS: 10.0000 mg | ORAL_TABLET | Freq: Every day | ORAL | 0 refills | Status: DC

## 2020-05-12 NOTE — Assessment & Plan Note (Signed)
Assessment: 30 year old male presents for hypertension follow-up.  He has been currently taking amlodipine 5 mg/day.  Blood pressure ranges over the past 1 week have continued to remain elevated.  Blood pressure in office today of 158/90, 150/90 on recheck.  Patient endorses no difficulty with taking or affording his medications.  Endorses no adverse effects at this time Plan: -We will increase amlodipine to 10 mg/day.  I discussed with the patient on taking 2 of his 5 mg tablets each day until he runs out and I have sent in a prescription for 10 mg tablets which he will take 1 of at that time -Recommend he continue to check his blood pressures and follow-up in 1 month bring in these numbers so we can see his average pressures at home

## 2020-06-26 NOTE — Progress Notes (Signed)
    SUBJECTIVE:   CHIEF COMPLAINT / HPI:   Hypertension: Patient is a 30 year old male presented for follow-up with hypertension.  He is currently maintained on amlodipine 10 mg/day.  He does check his blood pressures at home.  During her previous appointment we discussed him checking his blood pressures and bring in these numbers and to this appointment.  He has been seeing blood pressure measurements in the 115-120 systolic range the most of his checks.  He has not seen any numbers less than 110 systolic.  Spanish interpreter used for the duration of the patient's encounter.  PERTINENT  PMH / PSH: History of spina bifida  OBJECTIVE:   BP 134/80   Pulse 95   SpO2 97%    General: NAD, pleasant, able to participate in exam Cardiac: RRR, no murmurs. Respiratory: CTAB, normal effort Psych: Normal affect and mood  ASSESSMENT/PLAN:   Hypertension Assessment: 30 year old male who is wheelchair-bound with a history of spina bifida presenting for follow-up of his blood pressure.  Blood pressure today is improved at 134/80.  Patient has a list of blood pressures that he has been keeping for several weeks multiple times per day and most of them range in the 115-120 systolic range.  Patient does not have any low blood pressures less than the 110 systolic range documented.  He is currently on amlodipine 10 mg/day. Plan: -Continue amlodipine 10 mg/day -Follow-up in 6 to 12 months.     Jackelyn Poling, DO [facility]    This note was prepared using Dragon voice recognition software and may include unintentional dictation errors due to the inherent limitations of voice recognition software.

## 2020-06-26 NOTE — Patient Instructions (Signed)
It was great to see you! Thank you for allowing me to participate in your care!  I recommend that you always bring your medications to each appointment as this makes it easy to ensure we are on the correct medications and helps [place] not miss when refills are needed.  Our plans for today:  -Continue amlodipine at 10 mg/day -Follow-up in 6 to 12 months  Take care and seek immediate care sooner if you develop any concerns.   Dr. Jackelyn Poling, DO [facility]

## 2020-06-27 ENCOUNTER — Ambulatory Visit (INDEPENDENT_AMBULATORY_CARE_PROVIDER_SITE_OTHER): Payer: Medicare Other | Admitting: Family Medicine

## 2020-06-27 ENCOUNTER — Encounter: Payer: Self-pay | Admitting: Family Medicine

## 2020-06-27 ENCOUNTER — Other Ambulatory Visit: Payer: Self-pay

## 2020-06-27 DIAGNOSIS — I10 Essential (primary) hypertension: Secondary | ICD-10-CM | POA: Diagnosis present

## 2020-06-27 MED ORDER — AMLODIPINE BESYLATE 10 MG PO TABS: 10.0000 mg | ORAL_TABLET | Freq: Every day | ORAL | 3 refills | Status: DC

## 2020-06-27 NOTE — Assessment & Plan Note (Signed)
Assessment: 30 year old male who is wheelchair-bound with a history of spina bifida presenting for follow-up of his blood pressure.  Blood pressure today is improved at 134/80.  Patient has a list of blood pressures that he has been keeping for several weeks multiple times per day and most of them range in the 115-120 systolic range.  Patient does not have any low blood pressures less than the 110 systolic range documented.  He is currently on amlodipine 10 mg/day. Plan: -Continue amlodipine 10 mg/day -Follow-up in 6 to 12 months.

## 2021-08-06 ENCOUNTER — Encounter: Payer: Self-pay | Admitting: Family Medicine

## 2021-08-06 ENCOUNTER — Ambulatory Visit (INDEPENDENT_AMBULATORY_CARE_PROVIDER_SITE_OTHER): Payer: Medicare Other | Admitting: Family Medicine

## 2021-08-06 VITALS — BP 132/78 | HR 92

## 2021-08-06 DIAGNOSIS — R748 Abnormal levels of other serum enzymes: Secondary | ICD-10-CM

## 2021-08-06 DIAGNOSIS — Z0000 Encounter for general adult medical examination without abnormal findings: Secondary | ICD-10-CM

## 2021-08-06 DIAGNOSIS — I10 Essential (primary) hypertension: Secondary | ICD-10-CM | POA: Diagnosis not present

## 2021-08-06 DIAGNOSIS — Z23 Encounter for immunization: Secondary | ICD-10-CM

## 2021-08-06 MED ORDER — TETANUS-DIPHTH-ACELL PERTUSSIS 5-2.5-18.5 LF-MCG/0.5 IM SUSP: 0.5000 mL | Freq: Once | INTRAMUSCULAR | 0 refills | Status: AC

## 2021-08-06 MED ORDER — AMLODIPINE BESYLATE 10 MG PO TABS: 10.0000 mg | ORAL_TABLET | Freq: Every day | ORAL | 3 refills | Status: DC

## 2021-08-06 NOTE — Progress Notes (Signed)
? ? ?  SUBJECTIVE:  ? ?CHIEF COMPLAINT / HPI:  ? ?"Checkup": ?31 year old male presenting for the above.  He has a history of spina bifida, follows with urology for neurogenic bladder.  He has no initial concerns. Has a history of indwelling catheter due to inability to self cath. He denies any sacral ulcers or other ulcers and is not interested in a physical exam looking at that.  ? ?His only medication is amlodipine 10 mg daily.  He endorses taking this regularly. ? ?He continues to follow with urology and has an upcoming surgery as noted in his chart. ? ?Never smoker. No alcohol. No illicit drugs.  ? ?Spanish interpreter used for patient encounter ? ?PERTINENT  PMH / PSH: Spina bifida ? ?OBJECTIVE:  ? ?BP (!) 148/98   Pulse 92   SpO2 100%   ? ?BP 132/78 on recheck  ? ?General: NAD, pleasant, seated in wheelchair, right BKA ?Cardiac: RRR, no murmurs. ?Respiratory: CTAB, normal effort, No wheezes, rales or rhonchi ?Abdomen: Bowel sounds present, nontender ?Skin: warm and dry, discussed checking for sacral ulcers but patient refused ?Psych: Normal affect and mood ? ?ASSESSMENT/PLAN:  ? ?Elevated blood pressure with history of hypertension: ?Blood pressure of 148/98, manual recheck of 132/78.  Continues on amlodipine 10 mg daily.  Endorses taking this without missing doses.  We will provide refill for this.  Discussed checking his blood pressure regularly at home. ?  ?Elevated AST/ALT: ?Previously noted on lab work from 2021 with AST of 50, ALT of 91 ? ?Health Maintenance: ?Discussed health maintenance recommendations including Tdap, HIV screening. He is not interested in HIV screening.  ? ?Jackelyn Poling, DO ?[facility]  ? ? ? ?

## 2021-08-06 NOTE — Patient Instructions (Signed)
We are checking some lab work today and I will let you know the results with an interpreter when they return. ? ?I am giving you prescription to get your tetanus shot from the pharmacy.  I recommend doing this at a time that is convenient for you. ? ?I have also provided refills for your blood pressure medicine today. ? ?If you have any further concerns please let me know ?

## 2021-08-07 ENCOUNTER — Encounter: Payer: Self-pay | Admitting: Family Medicine

## 2021-08-07 DIAGNOSIS — R748 Abnormal levels of other serum enzymes: Secondary | ICD-10-CM | POA: Insufficient documentation

## 2021-08-07 LAB — COMPREHENSIVE METABOLIC PANEL
ALT: 20 IU/L (ref 0–44)
AST: 16 IU/L (ref 0–40)
Albumin/Globulin Ratio: 1.4 (ref 1.2–2.2)
Albumin: 4.4 g/dL (ref 4.1–5.2)
Alkaline Phosphatase: 132 IU/L — ABNORMAL HIGH (ref 44–121)
BUN/Creatinine Ratio: 14 (ref 9–20)
BUN: 12 mg/dL (ref 6–20)
Bilirubin Total: 0.3 mg/dL (ref 0.0–1.2)
CO2: 22 mmol/L (ref 20–29)
Calcium: 9 mg/dL (ref 8.7–10.2)
Chloride: 103 mmol/L (ref 96–106)
Creatinine, Ser: 0.88 mg/dL (ref 0.76–1.27)
Globulin, Total: 3.1 g/dL (ref 1.5–4.5)
Glucose: 123 mg/dL — ABNORMAL HIGH (ref 70–99)
Potassium: 3.8 mmol/L (ref 3.5–5.2)
Sodium: 139 mmol/L (ref 134–144)
Total Protein: 7.5 g/dL (ref 6.0–8.5)
eGFR: 119 mL/min/{1.73_m2} (ref >59)

## 2022-03-07 ENCOUNTER — Ambulatory Visit (INDEPENDENT_AMBULATORY_CARE_PROVIDER_SITE_OTHER): Payer: Medicare Other | Admitting: Student

## 2022-03-07 ENCOUNTER — Encounter: Payer: Self-pay | Admitting: Student

## 2022-03-07 VITALS — BP 141/89 | HR 100

## 2022-03-07 DIAGNOSIS — Z23 Encounter for immunization: Secondary | ICD-10-CM | POA: Diagnosis not present

## 2022-03-07 DIAGNOSIS — L738 Other specified follicular disorders: Secondary | ICD-10-CM | POA: Diagnosis not present

## 2022-03-07 DIAGNOSIS — I10 Essential (primary) hypertension: Secondary | ICD-10-CM

## 2022-03-07 MED ORDER — AMLODIPINE BESYLATE 10 MG PO TABS: 10.0000 mg | ORAL_TABLET | Freq: Every day | ORAL | 3 refills | Status: DC

## 2022-03-07 MED ORDER — MUPIROCIN CALCIUM 2 % EX CREA: TOPICAL_CREAM | Freq: Two times a day (BID) | CUTANEOUS | Status: DC

## 2022-03-07 NOTE — Progress Notes (Signed)
    SUBJECTIVE:   CHIEF COMPLAINT / HPI:   HTN Taking amlodipine 10 mg once daily. BP checks at home are typically less than 140/90. No chest pain, no difficulty breathing, no headaches, no dizziness, no swelling. Needs refill.   Rash  New onset pruritic pustular lesions near mouth (picture below). 2-3 months, worsening. Has tried cortisone cream without relief. No history of acne.   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 Cap refill <2s Resp: CTAB. Normal wob on RA Skin: Pustules on erythematous base under [place] border of mouth as below:   ASSESSMENT/PLAN:   Hypertension BP 141/89 on recheck today, however consistently below 140/90 at home per patient. Currently on amlodipine 10 mg. Using shared decision making, patient would like to continue on amlodipine alone- this is reasonable as he is very close to goal and nearing time for his evening dose. We will recheck at next visit. -Refilled amlodipine  -Check BP at next visit  Folliculitis barbae Pustules in areas of shaving. Pruritic. Patient reported trying hydrocortisone for 2 weeks without relief. No history of acne. Based on HPI and physical exam currently believe this is folliculitis. Differential includes acne and impetigo. We will trial antibiotic cream. -Mupirocin cream BID on affected areas   Follow up recommendations for provider: 1.) Recheck BP, consider adding second agent if uncontrolled. 2.) If pustules worsen, can consider treatment for acne however most medications would require prior auth.   Tiffany Kocher, DO [facility]

## 2022-03-07 NOTE — Assessment & Plan Note (Addendum)
BP 141/89 on recheck today, however consistently below 140/90 at home per patient. Currently on amlodipine 10 mg. Using shared decision making, patient would like to continue on amlodipine alone- this is reasonable as he is very close to goal and nearing time for his evening dose. We will recheck at next visit. -Refilled amlodipine  -Check BP at next visit

## 2022-03-07 NOTE — Assessment & Plan Note (Signed)
Pustules in areas of shaving. Pruritic. Patient reported trying hydrocortisone for 2 weeks without relief. No history of acne. Based on HPI and physical exam currently believe this is folliculitis. Differential includes acne and impetigo. We will trial antibiotic cream. -Mupirocin cream BID on affected areas

## 2022-03-07 NOTE — Patient Instructions (Signed)
It was great to see you! Thank you for allowing me to participate in your care!   I recommend that you always bring your medications to each appointment as this makes it easy to ensure we are on the correct medications and helps [place] not miss when refills are needed.  Our plans for today:  - Please use Bactroban twice daily on affected skin - Continue to take amlodipine 10 mg daily  Take care and seek immediate care sooner if you develop any concerns. Please remember to show up 15 minutes before your scheduled appointment time!  Tiffany Kocher, DO [facility]

## 2022-09-19 ENCOUNTER — Encounter: Payer: Self-pay | Admitting: Student

## 2022-09-19 ENCOUNTER — Ambulatory Visit (INDEPENDENT_AMBULATORY_CARE_PROVIDER_SITE_OTHER): Payer: Medicare Other | Admitting: Student

## 2022-09-19 ENCOUNTER — Other Ambulatory Visit: Payer: Self-pay

## 2022-09-19 VITALS — BP 136/82 | HR 99 | Ht 60.0 in

## 2022-09-19 DIAGNOSIS — Z114 Encounter for screening for human immunodeficiency virus [HIV]: Secondary | ICD-10-CM | POA: Diagnosis not present

## 2022-09-19 DIAGNOSIS — Z13228 Encounter for screening for other metabolic disorders: Secondary | ICD-10-CM

## 2022-09-19 DIAGNOSIS — L738 Other specified follicular disorders: Secondary | ICD-10-CM | POA: Diagnosis not present

## 2022-09-19 DIAGNOSIS — Z13220 Encounter for screening for lipoid disorders: Secondary | ICD-10-CM

## 2022-09-19 DIAGNOSIS — I10 Essential (primary) hypertension: Secondary | ICD-10-CM | POA: Diagnosis present

## 2022-09-19 MED ORDER — MUPIROCIN 2 % EX OINT: 1.0000 | TOPICAL_OINTMENT | Freq: Two times a day (BID) | CUTANEOUS | 0 refills | Status: DC

## 2022-09-19 NOTE — Progress Notes (Signed)
    SUBJECTIVE:   CHIEF COMPLAINT / HPI:   **[employer] spanish video interpreter Ivin Booty [phone] present during encounter**  HTN Patient presents for BP recheck. Prior elevations in office with normal readings at home. Checks pressure daily, typically 120s/80s. Endorses increased anxiety around doctor appointments. Has taken his amlodipine 10 mg today. Compliant and without side-effects. Initial reading 153/89, 136/82 on recheck.  Folliculitis Barbae Reports he did not receive mupirocin cream from prior visit. Reordered.  PERTINENT  PMH / PSH: Spina Bifida with Hydrocephalus Lumbar Region  OBJECTIVE:   BP 136/82   Pulse 99   Ht 5' (1.524 m)   SpO2 97%   BMI 35.15 kg/m    General: NAD, well-appearing, wheelchair bound Respiratory: No respiratory distress, breathing comfortably, able to speak in full sentences Skin: warm and dry, no rashes noted on exposed skin Psych: Appropriate affect and mood  ASSESSMENT/PLAN:   Hypertension 136/82 on recheck. Well-controlled on current regimen, no changes today. -Amlodipine 10 mg daily -CMP  Folliculitis barbae Trial topical mupirocin for 2 weeks. -Follow-up as necessary   Health maintenance HIV screening: Obtained Lipid screening: Obtained  Tiffany Kocher, DO [facility]

## 2022-09-19 NOTE — Assessment & Plan Note (Signed)
136/82 on recheck. Well-controlled on current regimen, no changes today. -Amlodipine 10 mg daily -CMP

## 2022-09-19 NOTE — Assessment & Plan Note (Signed)
Trial topical mupirocin for 2 weeks. -Follow-up as necessary

## 2022-09-19 NOTE — Patient Instructions (Addendum)
It was great to see you! Thank you for allowing me to participate in your care!   I recommend that you always bring your medications to each appointment as this makes it easy to ensure we are on the correct medications and helps [place] not miss when refills are needed.  Our plans for today:  - Continue to take amlodipine 10 mg for blood pressure - Apply mupirocin cream twice a day to face for 15 days.   We are checking some labs today, I will call you if they are abnormal will send you a MyChart message or a letter if they are normal.  If you do not hear about your labs in the next 2 weeks please let us know.  Nuestros planes para hoy:  - Contine tomando amlodipino 10 mg para la presin arterial. - Aplicar crema de Theme park manager al da en el rostro durante 15 das.   Estamos revisando algunos anlisis de laboratorio hoy. Lo llamar si son anormales y Transport planner un mensaje de MyChart o Neomia Dear carta si son normales.  Si no tiene noticias sobre sus laboratorios en las prximas 2 semanas, hganoslo saber.  Take care and seek immediate care sooner if you develop any concerns. Please remember to show up 15 minutes before your scheduled appointment time!  Tiffany Kocher, DO [facility]

## 2022-09-20 ENCOUNTER — Telehealth: Payer: Self-pay | Admitting: Student

## 2022-09-20 LAB — HIV ANTIBODY (ROUTINE TESTING W REFLEX): HIV Screen 4th Generation wRfx: NONREACTIVE

## 2022-09-20 LAB — COMPREHENSIVE METABOLIC PANEL
ALT: 25 IU/L (ref 0–44)
AST: 13 IU/L (ref 0–40)
Albumin: 4.2 g/dL — ABNORMAL LOW (ref 4.3–5.2)
Alkaline Phosphatase: 128 IU/L — ABNORMAL HIGH (ref 44–121)
BUN/Creatinine Ratio: 16 (ref 9–20)
BUN: 16 mg/dL (ref 6–20)
Bilirubin Total: 0.2 mg/dL (ref 0.0–1.2)
CO2: 21 mmol/L (ref 20–29)
Calcium: 9.1 mg/dL (ref 8.7–10.2)
Chloride: 102 mmol/L (ref 96–106)
Creatinine, Ser: 0.97 mg/dL (ref 0.76–1.27)
Globulin, Total: 3 g/dL (ref 1.5–4.5)
Glucose: 117 mg/dL — ABNORMAL HIGH (ref 70–99)
Potassium: 4.1 mmol/L (ref 3.5–5.2)
Sodium: 137 mmol/L (ref 134–144)
Total Protein: 7.2 g/dL (ref 6.0–8.5)
eGFR: 108 mL/min/{1.73_m2} (ref >59)

## 2022-09-20 LAB — LIPID PANEL
Chol/HDL Ratio: 6.3 ratio — ABNORMAL HIGH (ref 0.0–5.0)
Cholesterol, Total: 196 mg/dL (ref 100–199)
HDL: 31 mg/dL — ABNORMAL LOW (ref >39)
LDL Chol Calc (NIH): 128 mg/dL — ABNORMAL HIGH (ref 0–99)
Triglycerides: 205 mg/dL — ABNORMAL HIGH (ref 0–149)
VLDL Cholesterol Cal: 37 mg/dL (ref 5–40)

## 2022-09-20 NOTE — Telephone Encounter (Signed)
Called patient using [place] interpreter language line, Spanish interpreter.  Confirmed patient's identity.  Discussed lab work, overall unremarkable.  Slightly elevated APT, will recheck in approximately 3 months appointment has been made.  Will also check A1c for diabetes screening.  Cholesterol slightly elevated, however medication not indicated at this time.  Patient expressed understanding and agreement with plan.

## 2022-12-06 ENCOUNTER — Ambulatory Visit (INDEPENDENT_AMBULATORY_CARE_PROVIDER_SITE_OTHER): Payer: Medicare Other | Admitting: Student

## 2022-12-06 VITALS — BP 122/89 | HR 99

## 2022-12-06 DIAGNOSIS — R748 Abnormal levels of other serum enzymes: Secondary | ICD-10-CM | POA: Diagnosis present

## 2022-12-06 DIAGNOSIS — L738 Other specified follicular disorders: Secondary | ICD-10-CM | POA: Diagnosis not present

## 2022-12-06 DIAGNOSIS — Z23 Encounter for immunization: Secondary | ICD-10-CM | POA: Diagnosis present

## 2022-12-06 DIAGNOSIS — Z131 Encounter for screening for diabetes mellitus: Secondary | ICD-10-CM

## 2022-12-06 LAB — POCT GLYCOSYLATED HEMOGLOBIN (HGB A1C): Hemoglobin A1C: 5.4 % (ref 4.0–5.6)

## 2022-12-06 MED ORDER — MUPIROCIN 2 % EX OINT: 1.0000 | TOPICAL_OINTMENT | Freq: Two times a day (BID) | CUTANEOUS | 0 refills | Status: DC

## 2022-12-06 NOTE — Assessment & Plan Note (Signed)
No GI symptoms. Hx of cholecystectomy. Differential includes intrahepatic and extrahepatic causes. Will obtain RUQ U/S if ALP +GGT elevated. If GGT negative and ALP remains high, I would recommend DEXA scan as patient is paraplegic and may have early onset osteopenia.  -GGT/CMP

## 2022-12-06 NOTE — Assessment & Plan Note (Signed)
Patient had recurrence of his folliculitis barbae. Will send more topical antibiotic. Advised against use of razor blade for shaving if he continues to get infection. -Mupirocin BID

## 2022-12-06 NOTE — Progress Notes (Signed)
    SUBJECTIVE:   CHIEF COMPLAINT / HPI:   Elevated ALP Noted on recent CMP. Hx of elevation in the past. Denies abd pain, NVD. Denies alcohol use. Hx of cholecystectomy in 2018.   Diabetes screening Does not appear patient has ever had A1c testing. 32 y.o wheel chair bound male with hyperlipidemia. Will obtain A1c for risk stratifying purposes. Denies polyuria, polyphagia.  PERTINENT  PMH / PSH: Spina Bifida, Wheelchair bound, Urinary incontinence without sensory awareness  OBJECTIVE:   BP 122/89   Pulse 99   SpO2 100%    General: NAD, pleasant, wheel chair bound Cardio: RRR, no MRG. Cap Refill <2s. Respiratory: CTAB, normal wob on RA GI: Abdomen is soft, not tender, not distended. BS present  ASSESSMENT/PLAN:   Assessment & Plan Elevated alkaline phosphatase level No GI symptoms. Hx of cholecystectomy. Differential includes intrahepatic and extrahepatic causes. Will obtain RUQ U/S if ALP +GGT elevated. If GGT negative and ALP remains high, I would recommend DEXA scan as patient is paraplegic and may have early onset osteopenia.  -GGT/CMP Diabetes mellitus screening Hgb A1c of 5.4. Does not have diabetes. Folliculitis barbae Patient had recurrence of his folliculitis barbae. Will send more topical antibiotic. Advised against use of razor blade for shaving if he continues to get infection. -Mupirocin BID Encounter for immunization Flu shot given today.   Tiffany Kocher, DO [facility]

## 2022-12-06 NOTE — Patient Instructions (Signed)
  Estuvo muy bien verte! Gracias por permitirme participar en su cuidado!   Nuestros planes para hoy: - Utilice crema de permetrina al 5%. - Por favor acuda a su cita con dermatologa.   Tenga cuidado y busque atencin inmediata lo antes posible si tiene alguna inquietud. Recuerde presentarse 15 minutos antes de la hora programada para su cita!  Martin Bronson, DO [facility]  

## 2022-12-07 LAB — COMPREHENSIVE METABOLIC PANEL
ALT: 26 IU/L (ref 0–44)
AST: 13 IU/L (ref 0–40)
Albumin: 4.6 g/dL (ref 4.1–5.1)
Alkaline Phosphatase: 145 IU/L — ABNORMAL HIGH (ref 44–121)
BUN/Creatinine Ratio: 15 (ref 9–20)
BUN: 14 mg/dL (ref 6–20)
Bilirubin Total: 0.4 mg/dL (ref 0.0–1.2)
CO2: 20 mmol/L (ref 20–29)
Calcium: 9 mg/dL (ref 8.7–10.2)
Chloride: 101 mmol/L (ref 96–106)
Creatinine, Ser: 0.95 mg/dL (ref 0.76–1.27)
Globulin, Total: 3.3 g/dL (ref 1.5–4.5)
Glucose: 138 mg/dL — ABNORMAL HIGH (ref 70–99)
Potassium: 3.8 mmol/L (ref 3.5–5.2)
Sodium: 140 mmol/L (ref 134–144)
Total Protein: 7.9 g/dL (ref 6.0–8.5)
eGFR: 110 mL/min/{1.73_m2} (ref >59)

## 2022-12-07 LAB — GAMMA GT: GGT: 48 IU/L (ref 0–65)

## 2022-12-18 ENCOUNTER — Telehealth: Payer: Self-pay

## 2022-12-18 DIAGNOSIS — G8220 Paraplegia, unspecified: Secondary | ICD-10-CM

## 2022-12-18 DIAGNOSIS — Q052 Lumbar spina bifida with hydrocephalus: Secondary | ICD-10-CM

## 2022-12-18 NOTE — Telephone Encounter (Signed)
Patient calls nurse line requesting an order for new electric wheelchair.   He reports his has been malfunctioning over the last month and reports its time for a new one. He reports this current chair is over 31 years old.   He asks we send order to [employer].  Will forward to PCP to place order.   Hopefully he will not need an additional office visit for insurance approval.

## 2022-12-18 NOTE — Telephone Encounter (Signed)
DME order placed for electric wheelchair. Send order to [employer]. My last progress note mentions he is wheelchair bound and paraplegic.

## 2022-12-20 NOTE — Telephone Encounter (Signed)
Placed completed order in medical records for last office visit to be attached.   Spoke with [employer]. She has printed these records and faxed order to [employer].     Veronda Prude, RN

## 2022-12-23 ENCOUNTER — Telehealth: Payer: Self-pay | Admitting: Student

## 2022-12-23 DIAGNOSIS — Z09 Encounter for follow-up examination after completed treatment for conditions other than malignant neoplasm: Secondary | ICD-10-CM

## 2022-12-23 DIAGNOSIS — R748 Abnormal levels of other serum enzymes: Secondary | ICD-10-CM

## 2022-12-23 NOTE — Telephone Encounter (Signed)
Called patient using language line, Spanish interpreter.  Confirmed patient's identity.  Discussed elevated ALP, not of hepatic origin.  He has risk factors for early osteoporosis given wheelchair-bound state.  Will order DEXA scan.  Patient expressed understanding and agreed with plan.

## 2023-01-07 NOTE — Telephone Encounter (Signed)
Patient calls clinic due to not hearing an update from [employer].   Called [employer]. They received the fax with the order and progress notes. They report that they will need a referral for PT wheelchair assessment.   Once this order is placed, it will then need to be faxed to [phone] attention: Irving Burton.   Veronda Prude, RN

## 2023-01-07 NOTE — Telephone Encounter (Signed)
Referral for Wheel Chair Eval ordered.

## 2023-01-07 NOTE — Addendum Note (Signed)
Addended by: Tiffany Kocher on: 01/07/2023 01:09 PM   Modules accepted: Orders

## 2023-01-15 ENCOUNTER — Ambulatory Visit (INDEPENDENT_AMBULATORY_CARE_PROVIDER_SITE_OTHER): Payer: Medicare Other | Admitting: Student

## 2023-01-15 ENCOUNTER — Encounter: Payer: Self-pay | Admitting: Student

## 2023-01-15 VITALS — BP 140/90 | HR 88 | Ht 60.0 in

## 2023-01-15 DIAGNOSIS — R030 Elevated blood-pressure reading, without diagnosis of hypertension: Secondary | ICD-10-CM

## 2023-01-15 DIAGNOSIS — Z0000 Encounter for general adult medical examination without abnormal findings: Secondary | ICD-10-CM

## 2023-01-15 NOTE — Assessment & Plan Note (Signed)
140/90, usually has good control. Taking amlodopine 10 mg daily, compliant no side-effects. -Follow-up 2 weeks -Consider additional medication

## 2023-01-15 NOTE — Patient Instructions (Addendum)
It was great to see you! Thank you for allowing me to participate in your care!   I recommend that you always bring your medications to each appointment as this makes it easy to ensure we are on the correct medications and helps [place] not miss when refills are needed.  Our plans for today:  - Complete your advanced Directive paper-work, bring it back to the front at your earliest convenience - Expect a call for medicare annual wellness - Follow-up in 2 weeks for blood pressure recheck - Please obtain Dexa scan in April and schedule appointment to see Dr. Claudean Severance  We are checking some labs today, I will call you if they are abnormal will send you a MyChart message or a letter if they are normal.  If you do not hear about your labs in the next 2 weeks please let us know.  Take care and seek immediate care sooner if you develop any concerns. Please remember to show up 15 minutes before your scheduled appointment time!  Tiffany Kocher, DO [facility]

## 2023-01-15 NOTE — Progress Notes (Signed)
    SUBJECTIVE:   Chief compliant/HPI: annual examination  Hector Shea is a 32 y.o. who presents today for an annual exam..  Needs a new electric wheelchair, discussed that referral was sent last week for neurorehab evaluation-after this evaluation insurance will cover wheelchair.  Discussed this may take several weeks.  Patient does not have advanced directives, but interested in obtaining them today.  He has no acute concerns.  Reviewed and updated history.  OBJECTIVE:   BP (!) 140/90   Pulse 88   Ht 5' (1.524 m)   SpO2 98%   BMI 35.15 kg/m    General: NAD, pleasant, wheelchair-bound Cardio: RRR, no MRG. Cap Refill <2s. Respiratory: CTAB, normal wob on RA GI: Abdomen is soft, not tender, not distended. BS present Skin: Warm and dr  ASSESSMENT/PLAN:   Assessment & Plan Annual visit for general adult medical examination without abnormal findings See AVS for age appropriate recommendations  Blood pressure reviewed and not at goal (see below)   Advanced directives discussed, paper-work provided Patient agrees to complete medicare annual wellness. Referral message sent.  Considered the following items based upon USPSTF recommendations: HIV testing: not indicated Hepatitis C: not indicated Hepatitis B: not indicated Syphilis if at high risk: {not indicated GC/CTnot indicated Lipid panel (nonfasting or fasting) discussed based upon AHA recommendations and already completed. Consider repeat every 4-6 years.  Reviewed risk factors for latent tuberculosis and not indicated Immunizations up to date   Follow up in 1 year or sooner if indicated.  Elevated blood pressure reading 140/90, usually has good control. Taking amlodopine 10 mg daily, compliant no side-effects. -Follow-up 2 weeks -Consider additional medication    Tiffany Kocher, DO [facility]

## 2023-01-17 ENCOUNTER — Ambulatory Visit: Payer: Medicare Other

## 2023-01-17 ENCOUNTER — Telehealth: Payer: Self-pay | Admitting: Student

## 2023-01-17 VITALS — Ht 60.0 in | Wt 180.0 lb

## 2023-01-17 DIAGNOSIS — Z0000 Encounter for general adult medical examination without abnormal findings: Secondary | ICD-10-CM

## 2023-01-17 NOTE — Patient Instructions (Signed)
Mr. Hector Shea , Thank you for taking time to come for your Medicare Wellness Visit. I appreciate your ongoing commitment to your health goals. Please review the following plan we discussed and let me know if I can assist you in the future.   Referrals/Orders/Follow-Ups/Clinician Recommendations: Aim for 30 minutes of exercise or brisk walking, 6-8 glasses of water, and 5 servings of fruits and vegetables each day.  This is a list of the screening recommended for you and due dates:  Health Maintenance  Topic Date Due   DTaP/Tdap/Td vaccine (2 - Td or Tdap) 02/10/2021   Medicare Annual Wellness Visit  01/17/2024   Flu Shot  Completed   COVID-19 Vaccine  Completed   Hepatitis C Screening  Completed   HIV Screening  Completed   HPV Vaccine  Aged Out    Advanced directives: (ACP Link)Information on Advanced Care Planning can be found at [facility] Advance Health Care Directives Advance Health Care Directives ([email or web address]   Next Medicare Annual Wellness Visit scheduled for next year: Yes

## 2023-01-17 NOTE — Progress Notes (Signed)
Subjective:   Hector Shea is a 32 y.o. male who presents for an Initial Medicare Annual Wellness Visit.  Visit Complete: Virtual I connected with  Hector Shea on 01/17/23 by a audio enabled telemedicine application and verified that I am speaking with the correct person using two identifiers.  Patient Location: Home  Provider Location: Home Office  I discussed the limitations of evaluation and management by telemedicine. The patient expressed understanding and agreed to proceed.  Vital Signs: Because this visit was a virtual/telehealth visit, some criteria may be missing or patient reported. Any vitals not documented were not able to be obtained and vitals that have been documented are patient reported.  Cardiac Risk Factors include: male gender;hypertension     Objective:    Today's Vitals   01/17/23 2242  Weight: 180 lb (81.6 kg)  Height: 5' (1.524 m)   Body mass index is 35.15 kg/m.     01/17/2023   10:45 PM 01/15/2023    8:33 AM 12/06/2022    2:14 PM 09/19/2022    2:21 PM 03/07/2022    3:00 PM 06/27/2020    3:47 PM 05/12/2020    3:47 PM  Advanced Directives  Does Patient Have a Medical Advance Directive? No No No No No No No  Would patient like information on creating a medical advance directive? Yes (MAU/Ambulatory/Procedural Areas - Information given) No - Patient declined  No - Patient declined No - Patient declined No - Patient declined No - Patient declined    Current Medications (verified) Outpatient Encounter Medications as of 01/17/2023  Medication Sig   amLODipine (NORVASC) 10 MG tablet Take 1 tablet (10 mg total) by mouth daily.   Blood Pressure Monitoring (BLOOD PRESSURE CUFF) MISC Check BP twice per day for one week and record numbers   mupirocin ointment (BACTROBAN) 2 % Apply 1 Application topically 2 (two) times daily.   oxybutynin (DITROPAN) 5 MG tablet Take 2 tablets in the morning, 1 tablet at lunch, and 2 tablets at night  for bladder spasm. (Patient taking differently: Take 5-10 mg by mouth See admin instructions. Take 2 tablets in the morning, 1 tablet at lunch, and 2 tablets at night for bladder spasm.)   No facility-administered encounter medications on file as of 01/17/2023.    Allergies (verified) Advil [ibuprofen]   History: Past Medical History:  Diagnosis Date   Neurogenic bladder    Spina bifida [facility])    Past Surgical History:  Procedure Laterality Date   ANKLE SURGERY  2001   CHOLECYSTECTOMY N/A 04/30/2016   Procedure: LAPAROSCOPIC CHOLECYSTECTOMY;  Surgeon: Gaynelle Adu, MD;  Location[facility] OR;  Service: General;  Laterality: N/A;   ENDOSCOPIC RETROGRADE CHOLANGIOPANCREATOGRAPHY (ERCP) WITH PROPOFOL N/A 04/29/2016   Procedure: ENDOSCOPIC RETROGRADE CHOLANGIOPANCREATOGRAPHY (ERCP) WITH PROPOFOL;  Surgeon: Jeani Hawking, MD;  Location: [facility] ENDOSCOPY;  Service: Endoscopy;  Laterality: N/A;   mass removed right leg  1998   MENINGOCELE REPAIR  05-10-91   In [place], no details   SACRAL DECUBITUS ULCER EXCISION  2007   VENTRICULOPERITONEAL SHUNT  1993   Family History  Problem Relation Age of Onset   Hypertension Mother    Hyperlipidemia Mother    Diabetes Father        on renal dialysis   Social History   Socioeconomic History   Marital status: Single    Spouse name: Not on file   Number of children: Not on file   Years of education: 7   Highest  education level: Not on file  Occupational History   Not on file  Tobacco Use   Smoking status: Never   Smokeless tobacco: Never  Substance and Sexual Activity   Alcohol use: No   Drug use: No   Sexual activity: Not on file  Other Topics Concern   Not on file  Social History Narrative   Moved to [place] From [place] with his mother to join his father and brothers in [place]   Six years of education in [place]   Currently a student in newcomers classes   Social Determinants of Health   Financial Resource Strain: Low Risk  (01/17/2023)    Overall Financial Resource Strain (CARDIA)    Difficulty of Paying Living Expenses: Not hard at all  Food Insecurity: No Food Insecurity (01/17/2023)   Hunger Vital Sign    Worried About Running Out of Food in the Last Year: Never true    Ran Out of Food in the Last Year: Never true  Transportation Needs: No Transportation Needs (01/17/2023)   PRAPARE - Administrator, Civil Service (Medical): No    Lack of Transportation (Non-Medical): No  Physical Activity: Inactive (01/17/2023)   Exercise Vital Sign    Days of Exercise per Week: 0 days    Minutes of Exercise per Session: 0 min  Stress: No Stress Concern Present (01/17/2023)   [employer] of Occupational Health - Occupational Stress Questionnaire    Feeling of Stress : Only a little  Social Connections: Moderately Isolated (01/17/2023)   Social Connection and Isolation Panel [NHANES]    Frequency of Communication with Friends and Family: More than three times a week    Frequency of Social Gatherings with Friends and Family: Three times a week    Attends Religious Services: 1 to 4 times per year    Active Member of Clubs or Organizations: No    Attends Banker Meetings: Never    Marital Status: Never married    Tobacco Counseling Counseling given: Not Answered   Clinical Intake:  Pre-visit preparation completed: Yes  Pain : No/denies pain     Diabetes: No  How often do you need to have someone help you when you read instructions, pamphlets, or other written materials from your doctor or pharmacy?: 1 - Never  Interpreter Needed?: Yes Interpreter Name: Misty Stanley Interpreter ID: [id number] Patient Declined Interpreter : No Patient signed [place] waiver: No  Information entered by :: Kandis Fantasia LPN   Activities of Daily Living    01/17/2023   10:44 PM  In your present state of health, do you have any difficulty performing the following activities:  Hearing? 0  Vision? 0  Difficulty  concentrating or making decisions? 0  Walking or climbing stairs? 0  Dressing or bathing? 0  Doing errands, shopping? 0  Preparing Food and eating ? N  Using the Toilet? N  In the past six months, have you accidently leaked urine? N  Do you have problems with loss of bowel control? N  Managing your Medications? N  Managing your Finances? N  Housekeeping or managing your Housekeeping? N    Patient Care Team: Tiffany Kocher, DO as PCP - General (Family Medicine)  Indicate any recent Medical Services you may have received from other than [facility] providers in the past year (date may be approximate).     Assessment:   This is a routine wellness examination for [place].  Hearing/Vision screen Hearing Screening - Comments:: Denies hearing difficulties  Vision Screening - Comments:: No vision problems; will schedule routine eye exam soon     Goals Addressed             This Visit's Progress    Obtain new wheelchair        Depression Screen    01/17/2023   10:46 PM 01/15/2023    8:38 AM 12/06/2022    2:14 PM 09/19/2022    2:21 PM 03/07/2022    3:00 PM 08/06/2021    3:00 PM 06/27/2020    3:47 PM  PHQ 2/9 Scores  PHQ - 2 Score 1  0 0 0 0 0  PHQ- 9 Score 1  0 0 0 0 7  Exception Documentation  Patient refusal         Fall Risk    01/17/2023   10:45 PM 09/19/2022    2:21 PM 04/26/2016   10:34 AM  Fall Risk   Falls in the past year? 0 0 No  Number falls in past yr: 0 0   Injury with Fall? 0 0   Risk for fall due to : No Fall Risks    Follow up Falls prevention discussed;Education provided;Falls evaluation completed      MEDICARE RISK AT HOME: Medicare Risk at Home Any stairs in or around the home?: No If so, are there any without handrails?: No Home free of loose throw rugs in walkways, pet beds, electrical cords, etc?: Yes Adequate lighting in your home to reduce risk of falls?: Yes Life alert?: No Use of a cane, walker or w/c?: Yes Grab bars in the bathroom?:  Yes Shower chair or bench in shower?: Yes Elevated toilet seat or a handicapped toilet?: Yes  TIMED UP AND GO:  Was the test performed? No    Cognitive Function:        01/17/2023   10:46 PM  6CIT Screen  What Year? 0 points  What month? 0 points  What time? 0 points  Count back from 20 0 points  Months in reverse 0 points  Repeat phrase 0 points  Total Score 0 points    Immunizations Immunization History  Administered Date(s) Administered   Influenza Split 02/11/2011   Influenza, Seasonal, Injecte, Preservative Fre 12/06/2022   Influenza,inj,Quad PF,6+ Mos 05/09/2016, 02/16/2020, 03/07/2022   PFIZER(Purple Top)SARS-COV-2 Vaccination 06/27/2019, 07/18/2019, 03/30/2020   Pfizer(Comirnaty)Fall Seasonal Vaccine 12 years and older 03/07/2022   Tdap 02/11/2011    TDAP status: Due, Education has been provided regarding the importance of this vaccine. Advised may receive this vaccine at local pharmacy or Health Dept. Aware to provide a copy of the vaccination record if obtained from local pharmacy or Health Dept. Verbalized acceptance and understanding.  Flu Vaccine status: Up to date  Pneumococcal vaccine status: Up to date  Covid-19 vaccine status: Information provided on how to obtain vaccines.   Qualifies for Shingles Vaccine? No    Screening Tests Health Maintenance  Topic Date Due   DTaP/Tdap/Td (2 - Td or Tdap) 02/10/2021   Medicare Annual Wellness (AWV)  01/17/2024   INFLUENZA VACCINE  Completed   COVID-19 Vaccine  Completed   Hepatitis C Screening  Completed   HIV Screening  Completed   HPV VACCINES  Aged Out    Health Maintenance  Health Maintenance Due  Topic Date Due   DTaP/Tdap/Td (2 - Td or Tdap) 02/10/2021      Lung Cancer Screening: (Low Dose CT Chest recommended if Age 9-80 years, 20 pack-year currently smoking OR have quit  w/in 15years.) does not qualify.   Lung Cancer Screening Referral: n/a  Additional Screening:  Hepatitis C  Screening: does qualify; Completed 04/26/16  Vision Screening: Recommended annual ophthalmology exams for early detection of glaucoma and other disorders of the eye. Is the patient up to date with their annual eye exam?  No  Who is the provider or what is the name of the office in which the patient attends annual eye exams? none If pt is not established with a provider, would they like to be referred to a provider to establish care? No .   Dental Screening: Recommended annual dental exams for proper oral hygiene  Community Resource Referral / Chronic Care Management: CRR required this visit?  No   CCM required this visit?  No    Plan:     I have personally reviewed and noted the following in the patient's chart:   Medical and social history Use of alcohol, tobacco or illicit drugs  Current medications and supplements including opioid prescriptions. Patient is not currently taking opioid prescriptions. Functional ability and status Nutritional status Physical activity Advanced directives List of other physicians Hospitalizations, surgeries, and ER visits in previous 12 months Vitals Screenings to include cognitive, depression, and falls Referrals and appointments  In addition, I have reviewed and discussed with patient certain preventive protocols, quality metrics, and best practice recommendations. A written personalized care plan for preventive services as well as general preventive health recommendations were provided to patient.     Kandis Fantasia [place], [place]   [id number]   After Visit Summary: (Mail) Due to this being a telephonic visit, the after visit summary with patients personalized plan was offered to patient via mail   Nurse Notes: Patient is asking for assistance in obtaining a new power wheelchair.  He is hoping to do this soon so that he can travel to [place] for [employer]

## 2023-01-17 NOTE — Telephone Encounter (Signed)
Patient called back

## 2023-01-27 NOTE — Progress Notes (Signed)
This encounter was created in error - please disregard. AWV was completed on 01/17/2023 by another NHA.

## 2023-01-28 ENCOUNTER — Ambulatory Visit (INDEPENDENT_AMBULATORY_CARE_PROVIDER_SITE_OTHER): Payer: Medicare Other | Admitting: Student

## 2023-01-28 ENCOUNTER — Encounter: Payer: Self-pay | Admitting: Student

## 2023-01-28 VITALS — BP 146/97 | HR 95

## 2023-01-28 DIAGNOSIS — I10 Essential (primary) hypertension: Secondary | ICD-10-CM | POA: Diagnosis present

## 2023-01-28 MED ORDER — AMLODIPINE BESY-BENAZEPRIL HCL 10-20 MG PO CAPS
1.0000 | ORAL_CAPSULE | Freq: Every day | ORAL | 2 refills | Status: DC
Start: 2023-01-28 — End: 2023-09-11

## 2023-01-28 MED ORDER — AMLODIPINE BESY-BENAZEPRIL HCL 10-20 MG PO CAPS
1.0000 | ORAL_CAPSULE | Freq: Every day | ORAL | 1 refills | Status: DC
Start: 2023-01-28 — End: 2023-01-28

## 2023-01-28 NOTE — Progress Notes (Signed)
    SUBJECTIVE:   CHIEF COMPLAINT / HPI:   Hypertension Elevated blood pressure readings from last visit.  Previously well-controlled with amlodipine 10 mg.  Compliant on amlodipine without side effects.  Patient kept blood pressure log.  On review has multiple readings of diastolic pressures greater than 90 most days of the week.  He is asymptomatic.   OBJECTIVE:   BP (!) 146/97   Pulse 95   SpO2 99%    General: NAD, well-appearing, wheel chair bound Respiratory: No respiratory distress, breathing comfortably, able to speak in full sentences Skin: warm and dry, no rashes noted on exposed skin Psych: Appropriate affect and mood  ASSESSMENT/PLAN:   Assessment & Plan Primary hypertension Elevated systolic and diastolic pressures in office and at home.  Patient would like to reduce pill burden. - Amlodipine-benazepril 10-20 mg daily - Follow-up in 2-4 weeks    Tiffany Kocher, DO [facility]

## 2023-01-28 NOTE — Assessment & Plan Note (Signed)
Elevated systolic and diastolic pressures in office and at home.  Patient would like to reduce pill burden. - Amlodipine-benazepril 10-20 mg daily - Follow-up in 2-4 weeks

## 2023-01-28 NOTE — Patient Instructions (Addendum)
It was great to see you! Thank you for allowing me to participate in your care!   I recommend that you always bring your medications to each appointment as this makes it easy to ensure we are on the correct medications and helps [place] not miss when refills are needed.  Our plans for today:  - Take one tablet of Lotrel (amlodipine/benazepril) once per day - Follow-up in 2-4 weeks  Take care and seek immediate care sooner if you develop any concerns. Please remember to show up 15 minutes before your scheduled appointment time!  Tiffany Kocher, DO [facility]

## 2023-02-21 ENCOUNTER — Ambulatory Visit (INDEPENDENT_AMBULATORY_CARE_PROVIDER_SITE_OTHER): Payer: Medicare Other | Admitting: Student

## 2023-02-21 ENCOUNTER — Encounter: Payer: Self-pay | Admitting: Student

## 2023-02-21 VITALS — BP 126/80 | HR 91

## 2023-02-21 DIAGNOSIS — R030 Elevated blood-pressure reading, without diagnosis of hypertension: Secondary | ICD-10-CM | POA: Diagnosis present

## 2023-02-21 NOTE — Progress Notes (Signed)
    SUBJECTIVE:   CHIEF COMPLAINT / HPI:   Hypertension Patient noted to have elevated blood pressure at last visit.  He was previously on amlodipine 10 mg, however we changed medication to 10 mg of amlodipine with 20 mg of Benazepril combination pill.  He reports compliance with medication and no side effects.  Blood pressures at home are within goal.  OBJECTIVE:   BP 126/80   Pulse 91   SpO2 99%    General: NAD, pleasant, wheelchair bound Cardio: RRR, no MRG. Respiratory: CTAB, normal wob on RA GI: Abdomen is soft, not tender, not distended. BS present Skin: Warm and dry  ASSESSMENT/PLAN:   Assessment & Plan Elevated blood pressure reading Blood pressure within goal.  Compliant with medications, no side effects.  Asymptomatic. - Continue amlodipine-benazepril 10-20 mg daily - Follow-up in 6 months  Tiffany Kocher, DO [facility]

## 2023-02-21 NOTE — Assessment & Plan Note (Signed)
Blood pressure within goal.  Compliant with medications, no side effects.  Asymptomatic. - Continue amlodipine-benazepril 10-20 mg daily - Follow-up in 6 months

## 2023-02-21 NOTE — Patient Instructions (Addendum)
It was great to see you! Thank you for allowing me to participate in your care!   I recommend that you always bring your medications to each appointment as this makes it easy to ensure we are on the correct medications and helps [place] not miss when refills are needed.  Our plans for today:  - Continue taking amlodipine-benazepril 10-20 mg daily - Follow-up in 6 months  Take care and seek immediate care sooner if you develop any concerns. Please remember to show up 15 minutes before your scheduled appointment time!  Tiffany Kocher, DO [facility]

## 2023-04-21 ENCOUNTER — Other Ambulatory Visit: Payer: Self-pay | Admitting: Student

## 2023-04-23 ENCOUNTER — Telehealth: Payer: Self-pay

## 2023-04-23 NOTE — Telephone Encounter (Signed)
Received call from [employer] on nurse line.   She reports they are waiting on signature from PCP for patients new wheel chair.   Forms are in PCP box for review.   She may send another, please disregard.

## 2023-04-25 NOTE — Telephone Encounter (Signed)
Covering for Dr. Miquel Dunn. She is out of the office until Feb 3. If she is the only one who can sign it, it will need to wait until she returns.  Latrelle Dodrill, MD

## 2023-04-25 NOTE — Telephone Encounter (Signed)
Received returned call from [place] regarding paperwork.   I was advised that only Dr. Miquel Dunn can sign this paperwork, as she signed the original order.   She states that Dr. Claudean Severance is unable to sign as he is not registered with Medicare.   Burna Mortimer is re-faxing the forms to the attention of Dr. Miquel Dunn.   Veronda Prude, RN

## 2023-05-12 NOTE — Telephone Encounter (Signed)
 Hector Shea with [employer] LVM on nurse line in regards to paperwork.   It appears Hector Shea filled out form and placed in fax pile last week.   I attempted to call Hector Shea to see if she ever received form or if another correction is needed.  However, she did not answer and I had to leave a VM asking her to call me back.   Will await her return call.

## 2023-05-15 NOTE — Telephone Encounter (Signed)
Hector Shea returned call to nurse line. She reports that [facility] written order form was blank.   Paperwork pulled from previously faxed. Found missing paperwork. Dr. Miquel Dunn signed off on paperwork. Faxed back to [employer].   Copy made and placed in batch scanning.   Veronda Prude, RN

## 2023-07-03 ENCOUNTER — Telehealth: Payer: Self-pay

## 2023-07-03 NOTE — Telephone Encounter (Signed)
 Received call from [employer] in regards to patients wheel chair.   She reports orders were faxed over last week for PCP ([place] must sign) to review.   Advised this has been an on going issue since the beginning of the year. Advised as of February it appeared all documents were signed and returned back to [employer].   She apologized, stating that due to recent billing code changes we must refill out paperwork.  Paperwork is in PCP box for review.

## 2023-09-09 ENCOUNTER — Ambulatory Visit: Admitting: Student

## 2023-09-10 ENCOUNTER — Other Ambulatory Visit: Payer: Self-pay | Admitting: Student

## 2023-09-10 DIAGNOSIS — I10 Essential (primary) hypertension: Secondary | ICD-10-CM

## 2023-09-30 ENCOUNTER — Ambulatory Visit: Admitting: Student

## 2023-09-30 ENCOUNTER — Encounter: Payer: Self-pay | Admitting: Student

## 2023-09-30 VITALS — BP 114/67 | HR 75 | Temp 99.0°F | Ht 60.0 in

## 2023-09-30 DIAGNOSIS — I10 Essential (primary) hypertension: Secondary | ICD-10-CM

## 2023-09-30 DIAGNOSIS — N3942 Incontinence without sensory awareness: Secondary | ICD-10-CM | POA: Diagnosis not present

## 2023-09-30 DIAGNOSIS — R748 Abnormal levels of other serum enzymes: Secondary | ICD-10-CM | POA: Diagnosis not present

## 2023-09-30 NOTE — Assessment & Plan Note (Signed)
 Previously worked up, we do have concern for bone mineral density given quadriplegia and wheelchair-bound-which may explain his elevated ALP. - Patient will call to schedule DEXA scan - Recheck CMP at next visit

## 2023-09-30 NOTE — Assessment & Plan Note (Signed)
 Continue oxybutynin.

## 2023-09-30 NOTE — Progress Notes (Signed)
    SUBJECTIVE:   CHIEF COMPLAINT / HPI:   Hypertension Currently on amlodipine -benazepril  10-20 mg daily.  Reports compliance.  Blood pressure well-controlled today.  He has no acute concerns.  Urinary incontinence Neurogenic bladder.  On oxybutynin , no side effects.  Does well for him.  Paraplegia Patient received his new wheelchair 3 months ago.  It is doing very well for him.  PERTINENT  PMH / PSH: Hypertension, spina bifida with hydrocephalus lumbar region, paraplegia, urinary incontinence without sensory awareness  OBJECTIVE:   BP 114/67   Pulse 75   Temp 99 F (37.2 C)   Ht 5' (1.524 m)   SpO2 98%   BMI 34.18 kg/m    General: NAD, pleasant, wheelchair-bound Cardio: RRR, no MRG. Cap Refill <2s. Respiratory: CTAB, normal wob on RA Skin: Warm and dry  ASSESSMENT/PLAN:   Assessment & Plan Primary hypertension Well-controlled. - Continue amlodipine -benazepril  10-20 mg daily Urinary incontinence without sensory awareness -Continue oxybutynin  Elevated alkaline phosphatase level Previously worked up, we do have concern for bone mineral density given quadriplegia and wheelchair-bound-which may explain his elevated ALP. - Patient will call to schedule DEXA scan - Recheck CMP at next visit   Follow-up recommendation Follow-up for annual exam October 2025, then space visits to yearly and as needed. Follow-up DEXA scan  Gladis Church, DO [facility]

## 2023-09-30 NOTE — Assessment & Plan Note (Signed)
Well controlled.  - Continue amlodipine-benazepril 10-20 mg daily.

## 2023-09-30 NOTE — Patient Instructions (Addendum)
 It was great to see you! Thank you for allowing me to participate in your care!   I recommend that you always bring your medications to each appointment as this makes it easy to ensure we are on the correct medications and helps us  not miss when refills are needed.  Our plans for today:  - Follow-up in 3 months for your annual exam -I recommend a bone density test. I ordered this test. This is to test the strength of your bones. It is like an x-ray. You need to call to schedule [phone]. This is located at [address] [facility] [address]---the same place mammograms are completed.    Take care and seek immediate care sooner if you develop any concerns. Please remember to show up 15 minutes before your scheduled appointment time!  Gladis Church, DO [facility]

## 2023-10-01 ENCOUNTER — Telehealth: Payer: Self-pay

## 2023-10-01 DIAGNOSIS — Q052 Lumbar spina bifida with hydrocephalus: Secondary | ICD-10-CM

## 2023-10-01 DIAGNOSIS — G839 Paralytic syndrome, unspecified: Secondary | ICD-10-CM

## 2023-10-01 DIAGNOSIS — R748 Abnormal levels of other serum enzymes: Secondary | ICD-10-CM

## 2023-10-01 NOTE — Telephone Encounter (Signed)
 Patient calls nurse line with spanish interpreter.   He requests he would like help scheduling his DEXA scan.   I called [facility]. She reports the [facility] is no longer providing DEXA scans.   She reports for [facility], send the order to the [facility].   Will forward to provider to change order.   Will call to schedule patient.

## 2023-10-02 NOTE — Telephone Encounter (Addendum)
 Called [facility].  The patient has been scheduled for 05/03/2024 @2pm . This is the soonest available apt.  Using a spanish interpreter called patient with details and directions.   Patient was appreciative.

## 2023-10-09 ENCOUNTER — Other Ambulatory Visit: Payer: Self-pay | Admitting: Student

## 2023-10-09 DIAGNOSIS — I10 Essential (primary) hypertension: Secondary | ICD-10-CM

## 2023-11-07 ENCOUNTER — Other Ambulatory Visit: Payer: Self-pay | Admitting: Family Medicine

## 2023-11-07 DIAGNOSIS — I10 Essential (primary) hypertension: Secondary | ICD-10-CM

## 2023-12-03 ENCOUNTER — Other Ambulatory Visit: Payer: Self-pay | Admitting: Student

## 2023-12-03 DIAGNOSIS — L738 Other specified follicular disorders: Secondary | ICD-10-CM

## 2023-12-06 ENCOUNTER — Other Ambulatory Visit: Payer: Self-pay | Admitting: Student

## 2023-12-06 DIAGNOSIS — I10 Essential (primary) hypertension: Secondary | ICD-10-CM

## 2023-12-28 ENCOUNTER — Other Ambulatory Visit: Payer: Self-pay | Admitting: Student

## 2023-12-28 DIAGNOSIS — L738 Other specified follicular disorders: Secondary | ICD-10-CM

## 2024-01-05 ENCOUNTER — Other Ambulatory Visit: Payer: Self-pay | Admitting: Student

## 2024-01-05 DIAGNOSIS — I10 Essential (primary) hypertension: Secondary | ICD-10-CM

## 2024-01-23 ENCOUNTER — Encounter: Payer: Self-pay | Admitting: Student

## 2024-01-23 ENCOUNTER — Ambulatory Visit (INDEPENDENT_AMBULATORY_CARE_PROVIDER_SITE_OTHER): Admitting: Student

## 2024-01-23 VITALS — BP 117/79 | HR 67 | Ht 60.0 in

## 2024-01-23 DIAGNOSIS — Z23 Encounter for immunization: Secondary | ICD-10-CM

## 2024-01-23 DIAGNOSIS — Z131 Encounter for screening for diabetes mellitus: Secondary | ICD-10-CM

## 2024-01-23 DIAGNOSIS — I10 Essential (primary) hypertension: Secondary | ICD-10-CM

## 2024-01-23 DIAGNOSIS — Z0000 Encounter for general adult medical examination without abnormal findings: Secondary | ICD-10-CM | POA: Diagnosis not present

## 2024-01-23 NOTE — Patient Instructions (Signed)
 It was great to see you! Thank you for allowing me to participate in your care!   I recommend that you always bring your medications to each appointment as this makes it easy to ensure we are on the correct medications and helps us  not miss when refills are needed.  Our plans for today:  - Follow-up next week for lab draw - You will receive a call for Medicare annual wellness visit  Take care and seek immediate care sooner if you develop any concerns. Please remember to show up 15 minutes before your scheduled appointment time!  Gladis Church, DO [facility]

## 2024-01-23 NOTE — Progress Notes (Signed)
    SUBJECTIVE:   Chief compliant/HPI: annual examination  Hector Shea is a 33 y.o. who presents today for an annual exam.   Reviewed and updated history .   No concerns today.   OBJECTIVE:   BP 117/79   Pulse 67   Ht 5' (1.524 m)   SpO2 100%   BMI 34.18 kg/m    General: NAD, pleasant, wheelchair-bound Cardio: RRR, no MRG.  Respiratory: CTAB, normal wob on RA Skin: Warm and dry  ASSESSMENT/PLAN:   Assessment & Plan Annual physical exam  Annual Examination  See AVS for age appropriate recommendations  Blood pressure reviewed and at goal.    Considered the following items based upon USPSTF recommendations: Diabetes screening: ordered HIV testing:Previously completed not high risk Hepatitis C: Previously completed not high risk Hepatitis B: Plan to review in CIR Syphilis if at high risk: Not high risk GC/CT not high risk Lipid panel (nonfasting or fasting) discussed based upon AHA recommendations and ordered.  Consider repeat every 4-6 years.  Reviewed risk factors for latent tuberculosis and not discussed Vaccinations: COVID and flu today.   Will schedule Medicare annual wellness visit  Follow up in 1 year or sooner if indicated.  MyChart Activation: Declined  Gladis Church, DO [facility]

## 2024-01-24 ENCOUNTER — Other Ambulatory Visit: Payer: Self-pay | Admitting: Student

## 2024-01-24 DIAGNOSIS — L738 Other specified follicular disorders: Secondary | ICD-10-CM

## 2024-01-26 ENCOUNTER — Other Ambulatory Visit

## 2024-01-26 ENCOUNTER — Ambulatory Visit: Payer: Self-pay | Admitting: Student

## 2024-01-26 DIAGNOSIS — Z131 Encounter for screening for diabetes mellitus: Secondary | ICD-10-CM | POA: Diagnosis present

## 2024-01-26 LAB — POCT GLYCOSYLATED HEMOGLOBIN (HGB A1C): Hemoglobin A1C: 5.5 % (ref 4.0–5.6)

## 2024-04-24 ENCOUNTER — Other Ambulatory Visit: Payer: Self-pay | Admitting: Student

## 2024-04-24 DIAGNOSIS — I10 Essential (primary) hypertension: Secondary | ICD-10-CM

## 2024-05-03 ENCOUNTER — Other Ambulatory Visit (HOSPITAL_BASED_OUTPATIENT_CLINIC_OR_DEPARTMENT_OTHER)

## 2024-05-04 ENCOUNTER — Inpatient Hospital Stay (HOSPITAL_BASED_OUTPATIENT_CLINIC_OR_DEPARTMENT_OTHER): Admission: RE | Admit: 2024-05-04 | Discharge: 2024-05-04 | Attending: Family Medicine | Admitting: Family Medicine

## 2024-05-04 DIAGNOSIS — G839 Paralytic syndrome, unspecified: Secondary | ICD-10-CM

## 2024-05-04 DIAGNOSIS — Q052 Lumbar spina bifida with hydrocephalus: Secondary | ICD-10-CM

## 2024-05-04 DIAGNOSIS — R748 Abnormal levels of other serum enzymes: Secondary | ICD-10-CM

## 2024-05-27 ENCOUNTER — Encounter
